# Patient Record
Sex: Male | Born: 1955
Health system: Southern US, Community
[De-identification: ages and names within clinical notes are randomized; demographics above are authoritative.]

## PROBLEM LIST (undated history)

## (undated) DIAGNOSIS — I10 Essential (primary) hypertension: Secondary | ICD-10-CM

## (undated) DIAGNOSIS — J449 Chronic obstructive pulmonary disease, unspecified: Secondary | ICD-10-CM

## (undated) DIAGNOSIS — M109 Gout, unspecified: Secondary | ICD-10-CM

## (undated) DIAGNOSIS — H269 Unspecified cataract: Secondary | ICD-10-CM

## (undated) DIAGNOSIS — N400 Enlarged prostate without lower urinary tract symptoms: Secondary | ICD-10-CM

## (undated) DIAGNOSIS — K219 Gastro-esophageal reflux disease without esophagitis: Secondary | ICD-10-CM

## (undated) DIAGNOSIS — M1990 Unspecified osteoarthritis, unspecified site: Secondary | ICD-10-CM

## (undated) DIAGNOSIS — E785 Hyperlipidemia, unspecified: Secondary | ICD-10-CM

## (undated) HISTORY — DX: Gout, unspecified: M10.9

## (undated) HISTORY — PX: CHOLECYSTECTOMY: SHX55

## (undated) HISTORY — PX: COLONOSCOPY: SHX174

## (undated) HISTORY — DX: Hyperlipidemia, unspecified: E78.5

## (undated) HISTORY — DX: Gastro-esophageal reflux disease without esophagitis: K21.9

## (undated) HISTORY — PX: CATARACT EXTRACTION: SUR2

## (undated) HISTORY — PX: CARPAL TUNNEL RELEASE: SHX101

## (undated) HISTORY — DX: Unspecified cataract: H26.9

## (undated) HISTORY — PX: APPENDECTOMY: SHX54

## (undated) HISTORY — PX: LASIK: SHX215

## (undated) HISTORY — DX: Benign prostatic hyperplasia without lower urinary tract symptoms: N40.0

## (undated) HISTORY — DX: Chronic obstructive pulmonary disease, unspecified: J44.9

## (undated) HISTORY — DX: Unspecified osteoarthritis, unspecified site: M19.90

## (undated) HISTORY — DX: Essential (primary) hypertension: I10

## (undated) HISTORY — PX: POLYPECTOMY: SHX149

---

## 1998-02-10 ENCOUNTER — Ambulatory Visit (HOSPITAL_COMMUNITY): Admission: RE | Admit: 1998-02-10 | Discharge: 1998-02-11 | Payer: Self-pay | Admitting: General Surgery

## 2000-06-13 ENCOUNTER — Encounter: Admission: RE | Admit: 2000-06-13 | Discharge: 2000-06-13 | Payer: Self-pay | Admitting: Family Medicine

## 2000-06-13 ENCOUNTER — Encounter: Payer: Self-pay | Admitting: Family Medicine

## 2001-05-13 ENCOUNTER — Other Ambulatory Visit: Admission: RE | Admit: 2001-05-13 | Discharge: 2001-05-13 | Payer: Self-pay | Admitting: Gastroenterology

## 2001-05-13 ENCOUNTER — Encounter (INDEPENDENT_AMBULATORY_CARE_PROVIDER_SITE_OTHER): Payer: Self-pay | Admitting: Specialist

## 2002-01-29 ENCOUNTER — Emergency Department (HOSPITAL_COMMUNITY): Admission: EM | Admit: 2002-01-29 | Discharge: 2002-01-29 | Payer: Self-pay | Admitting: Emergency Medicine

## 2002-01-29 ENCOUNTER — Encounter: Payer: Self-pay | Admitting: Emergency Medicine

## 2004-07-27 ENCOUNTER — Ambulatory Visit: Payer: Self-pay | Admitting: Family Medicine

## 2004-09-25 ENCOUNTER — Ambulatory Visit: Payer: Self-pay | Admitting: Family Medicine

## 2004-09-25 LAB — CONVERTED CEMR LAB: PSA: 0.96 ng/mL

## 2004-09-28 ENCOUNTER — Ambulatory Visit: Payer: Self-pay | Admitting: Gastroenterology

## 2005-03-20 ENCOUNTER — Ambulatory Visit: Payer: Self-pay | Admitting: Family Medicine

## 2008-01-26 ENCOUNTER — Encounter: Payer: Self-pay | Admitting: Family Medicine

## 2008-01-26 DIAGNOSIS — N400 Enlarged prostate without lower urinary tract symptoms: Secondary | ICD-10-CM | POA: Insufficient documentation

## 2008-01-26 DIAGNOSIS — M109 Gout, unspecified: Secondary | ICD-10-CM | POA: Insufficient documentation

## 2008-01-26 DIAGNOSIS — E785 Hyperlipidemia, unspecified: Secondary | ICD-10-CM | POA: Insufficient documentation

## 2008-01-26 DIAGNOSIS — K219 Gastro-esophageal reflux disease without esophagitis: Secondary | ICD-10-CM | POA: Insufficient documentation

## 2008-01-27 ENCOUNTER — Ambulatory Visit: Payer: Self-pay | Admitting: Family Medicine

## 2008-01-27 DIAGNOSIS — M79609 Pain in unspecified limb: Secondary | ICD-10-CM | POA: Insufficient documentation

## 2008-01-27 DIAGNOSIS — F17210 Nicotine dependence, cigarettes, uncomplicated: Secondary | ICD-10-CM | POA: Insufficient documentation

## 2008-01-27 DIAGNOSIS — R5381 Other malaise: Secondary | ICD-10-CM | POA: Insufficient documentation

## 2008-01-27 DIAGNOSIS — D126 Benign neoplasm of colon, unspecified: Secondary | ICD-10-CM | POA: Insufficient documentation

## 2008-01-27 DIAGNOSIS — R5383 Other fatigue: Secondary | ICD-10-CM

## 2008-01-27 DIAGNOSIS — R55 Syncope and collapse: Secondary | ICD-10-CM | POA: Insufficient documentation

## 2008-02-02 ENCOUNTER — Ambulatory Visit: Payer: Self-pay | Admitting: Family Medicine

## 2008-02-03 LAB — CONVERTED CEMR LAB
AST: 29 units/L (ref 0–37)
Albumin: 3.5 g/dL (ref 3.5–5.2)
Alkaline Phosphatase: 99 units/L (ref 39–117)
BUN: 7 mg/dL (ref 6–23)
Basophils Relative: 0.6 % (ref 0.0–1.0)
Chloride: 112 meq/L (ref 96–112)
Creatinine, Ser: 0.9 mg/dL (ref 0.4–1.5)
Eosinophils Relative: 0.9 % (ref 0.0–5.0)
GFR calc Af Amer: 114 mL/min
Glucose, Bld: 97 mg/dL (ref 70–99)
HCT: 43.9 % (ref 39.0–52.0)
HDL: 17.3 mg/dL — ABNORMAL LOW (ref >39.0)
MCV: 92 fL (ref 78.0–100.0)
Monocytes Absolute: 0.4 10*3/uL (ref 0.1–1.0)
Monocytes Relative: 4.3 % (ref 3.0–12.0)
PSA: 0.83 ng/mL (ref 0.10–4.00)
Platelets: 294 10*3/uL (ref 150–400)
Potassium: 4.6 meq/L (ref 3.5–5.1)
RBC: 4.78 M/uL (ref 4.22–5.81)
Total CHOL/HDL Ratio: 7.7
Total Protein: 6.4 g/dL (ref 6.0–8.3)
WBC: 9.3 10*3/uL (ref 4.5–10.5)

## 2008-02-05 ENCOUNTER — Encounter: Payer: Self-pay | Admitting: Family Medicine

## 2008-02-05 ENCOUNTER — Encounter: Admission: RE | Admit: 2008-02-05 | Discharge: 2008-02-05 | Payer: Self-pay | Admitting: Family Medicine

## 2008-02-07 ENCOUNTER — Emergency Department (HOSPITAL_COMMUNITY): Admission: EM | Admit: 2008-02-07 | Discharge: 2008-02-08 | Payer: Self-pay | Admitting: Emergency Medicine

## 2008-02-07 ENCOUNTER — Encounter: Payer: Self-pay | Admitting: Family Medicine

## 2008-02-14 ENCOUNTER — Emergency Department (HOSPITAL_COMMUNITY): Admission: EM | Admit: 2008-02-14 | Discharge: 2008-02-14 | Payer: Self-pay | Admitting: Internal Medicine

## 2008-02-25 ENCOUNTER — Encounter: Payer: Self-pay | Admitting: Gastroenterology

## 2008-02-26 ENCOUNTER — Ambulatory Visit: Payer: Self-pay | Admitting: Gastroenterology

## 2008-02-29 ENCOUNTER — Encounter: Payer: Self-pay | Admitting: Gastroenterology

## 2008-02-29 ENCOUNTER — Ambulatory Visit (HOSPITAL_COMMUNITY): Admission: RE | Admit: 2008-02-29 | Discharge: 2008-02-29 | Payer: Self-pay | Admitting: Gastroenterology

## 2008-03-02 ENCOUNTER — Ambulatory Visit: Payer: Self-pay | Admitting: Gastroenterology

## 2008-03-23 ENCOUNTER — Telehealth: Payer: Self-pay | Admitting: Gastroenterology

## 2008-03-28 ENCOUNTER — Encounter: Payer: Self-pay | Admitting: Gastroenterology

## 2008-04-04 ENCOUNTER — Telehealth: Payer: Self-pay | Admitting: Family Medicine

## 2008-05-24 HISTORY — PX: OTHER SURGICAL HISTORY: SHX169

## 2008-06-08 ENCOUNTER — Ambulatory Visit: Payer: Self-pay | Admitting: Family Medicine

## 2008-06-08 DIAGNOSIS — I880 Nonspecific mesenteric lymphadenitis: Secondary | ICD-10-CM | POA: Insufficient documentation

## 2008-06-22 ENCOUNTER — Ambulatory Visit: Payer: Self-pay | Admitting: Cardiovascular Disease

## 2008-07-07 ENCOUNTER — Ambulatory Visit: Payer: Self-pay | Admitting: Family Medicine

## 2008-07-07 ENCOUNTER — Encounter: Admission: RE | Admit: 2008-07-07 | Discharge: 2008-07-07 | Payer: Self-pay | Admitting: Family Medicine

## 2008-07-07 DIAGNOSIS — M545 Low back pain, unspecified: Secondary | ICD-10-CM | POA: Insufficient documentation

## 2008-07-12 ENCOUNTER — Encounter: Payer: Self-pay | Admitting: Family Medicine

## 2010-04-26 ENCOUNTER — Ambulatory Visit: Payer: Self-pay | Admitting: Family Medicine

## 2010-04-26 DIAGNOSIS — M7710 Lateral epicondylitis, unspecified elbow: Secondary | ICD-10-CM | POA: Insufficient documentation

## 2010-04-26 LAB — CONVERTED CEMR LAB
Nitrite: NEGATIVE
Protein, U semiquant: NEGATIVE
Urobilinogen, UA: 0.2
WBC Urine, dipstick: NEGATIVE

## 2010-05-01 LAB — CONVERTED CEMR LAB
ALT: 19 units/L (ref 0–53)
Basophils Absolute: 0.1 10*3/uL (ref 0.0–0.1)
Basophils Relative: 1.8 % (ref 0.0–3.0)
Bilirubin, Direct: 0.1 mg/dL (ref 0.0–0.3)
CO2: 24 meq/L (ref 19–32)
Chloride: 105 meq/L (ref 96–112)
Creatinine, Ser: 1 mg/dL (ref 0.4–1.5)
Eosinophils Relative: 1.8 % (ref 0.0–5.0)
Hemoglobin: 15.4 g/dL (ref 13.0–17.0)
Lymphocytes Relative: 28.1 % (ref 12.0–46.0)
Monocytes Relative: 5.3 % (ref 3.0–12.0)
Neutro Abs: 4.7 10*3/uL (ref 1.4–7.7)
PSA: 0.93 ng/mL (ref 0.10–4.00)
RBC: 4.81 M/uL (ref 4.22–5.81)
RDW: 13.5 % (ref 11.5–14.6)
Total CHOL/HDL Ratio: 8
Total Protein: 6.7 g/dL (ref 6.0–8.3)
VLDL: 97.8 mg/dL — ABNORMAL HIGH (ref 0.0–40.0)
WBC: 7.4 10*3/uL (ref 4.5–10.5)

## 2010-09-13 ENCOUNTER — Telehealth: Payer: Self-pay | Admitting: Family Medicine

## 2010-09-13 ENCOUNTER — Encounter: Payer: Self-pay | Admitting: Family Medicine

## 2010-10-23 NOTE — Letter (Signed)
Summary: CDL Form  CDL Form   Imported By: Lanelle Bal 05/01/2010 12:12:41  _____________________________________________________________________  External Attachment:    Type:   Image     Comment:   External Document

## 2010-10-23 NOTE — Assessment & Plan Note (Signed)
Summary: CPX/CDL FORMS/CLE   Vital Signs:  Patient profile:   55 year old male Height:      71.5 inches Weight:      197.75 pounds BMI:     27.29 Temp:     98 degrees F oral Pulse rate:   80 / minute Pulse rhythm:   regular BP sitting:   128 / 84  (left arm) Cuff size:   regular  Vitals Entered By: Lewanda Rife LPN (April 26, 2010 9:39 AM) CC: CPX and CDL form  Vision Screening:Left eye with correction: 20 / 20 Right eye with correction: 20 / 20 Both eyes with correction: 20 / 20        Vision Entered By: Lewanda Rife LPN (April 26, 2010 9:40 AM)  Hearing Screen 25db HL: Left  500 hz: 25db 1000 hz: 25db 2000 hz: 25db 4000 hz: 25db Right  500 hz: 25db 1000 hz: 25db 2000 hz: 25db 4000 hz: 25db    History of Present Illness: here for health mt and DOT form  has been feeling pretty good overall  wt is up 1 lb  one ear bothers him a little -- ? feels like something in it and it itches   R elbow bothers him -- pain occas  no swelling  worse to lift with outstretched arm  no numbness in fingers  nl grip  does shift gears with this arm continuously no meds for this    bp ok at 128/84 today  hearing and vision intact  last psa .83- no prostate or urinary difficulties   due colonosc for polyp in 2014  Td was 05  due for lipid check   smoking status -- still smokes 1 ppd  little more cough in the ams - with some phlegm  feels like he is ready to quit  used patches once and it worked quite well   chooses not to wear sunscreen despite the risks      Allergies (verified): No Known Drug Allergies  Past History:  Past Medical History: Last updated: 06/18/08 GERD Gout Hyperlipidemia Benign prostatic hypertrophy smoker  foot pain  Past Surgical History: Last updated: 06/22/2008 Colonoscopy- polyps (04/2001) Cholecystectomy Appendectomy Carpal Tunnel Release colonoscopy- polyp 6/09 CT abd with enlarged LN is small bowel mesentary 00  5/09 follow up CT -- decrease in LN size and resolution of mesenteric stranding , liver hemangioma 9/09  Family History: Last updated: 2008-06-18 Father: died colon cancer age 62 Mother:  Siblings: brother had CABG age 49  Social History: Last updated: 02/26/2008 Marital Status: Divorced Children: 2 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 Eyes:  Denies blurring, discharge, and eye irritation. ENT:  Denies ear discharge and earache. CV:  Denies chest pain or discomfort, lightheadness, and palpitations. Resp:  Complains of cough; denies wheezing. GI:  Denies abdominal pain, indigestion, nausea, and vomiting. GU:  Denies dysuria and urinary frequency. MS:  Complains of joint pain; denies joint redness and joint swelling. Derm:  Denies itching, lesion(s), poor wound healing, and rash. Neuro:  Denies headaches, numbness, and tingling. Psych:  Denies anxiety and depression. Endo:  Denies excessive thirst and excessive urination. Heme:  Denies abnormal bruising and bleeding.  Physical Exam  General:  Well-developed,well-nourished,in no acute distress; alert,appropriate and cooperative throughout examination Head:  normocephalic, atraumatic, and no abnormalities observed.  Eyes:  vision grossly intact, pupils equal, pupils round, and pupils reactive to light.  no conjunctival pallor, injection or icterus  Ears:  R ear normal and L ear normal.   Nose:  no nasal discharge.   Mouth:  pharynx pink and moist.   Neck:  supple with full rom and no masses or thyromegally, no JVD or carotid bruit  Chest Wall:  No deformities, masses, tenderness or gynecomastia noted. Lungs:  diffusely distant bs no rales/crackles or wheeze  Heart:  Normal rate and regular rhythm. S1 and S2 normal without gallop, murmur, click, rub or  other extra sounds. Abdomen:  Bowel sounds positive,abdomen soft and non-tender without masses, organomegaly or hernias noted. no renal bruits  Rectal:  No external abnormalities noted. Normal sphincter tone. No rectal masses or tenderness. Prostate:  Prostate gland firm and smooth, no enlargement, nodularity, tenderness, mass, asymmetry or induration. Msk:  R lateral elbow tenderness over epicondyle  no swelling  nl grip some pain on supination  nl rom , however   Pulses:  R and L carotid,radial,femoral,dorsalis pedis and posterior tibial pulses are full and equal bilaterally Extremities:  No clubbing, cyanosis, edema, or deformity noted with normal full range of motion of all joints.   Neurologic:  strength normal in all extremities, sensation intact to light touch, gait normal, and DTRs symmetrical and normal.   Skin:  Intact without suspicious lesions or rashes lentigos diffusely with much solar aging Cervical Nodes:  No lymphadenopathy noted Inguinal Nodes:  No significant adenopathy Psych:  normal affect, talkative and pleasant    Impression & Recommendations:  Problem # 1:  HEALTH MAINTENANCE EXAM (ICD-V70.0) reviewed health habits including diet, exercise and skin cancer prevention reviewed health maintenance list and family history  Orders: Venipuncture (31540) TLB-Lipid Panel (80061-LIPID) TLB-CBC Platelet - w/Differential (85025-CBCD) TLB-BMP (Basic Metabolic Panel-BMET) (80048-METABOL) TLB-Hepatic/Liver Function Pnl (80076-HEPATIC) TLB-TSH (Thyroid Stimulating Hormone) (84443-TSH) TLB-PSA (Prostate Specific Antigen) (84153-PSA) UA Dipstick w/o Micro (manual) (08676)  Problem # 2:  TOBACCO USE (ICD-305.1) Assessment: Unchanged adv to quit again feels he may be ready to try nicotine repl otc  rev other aids avail to him discussed in detail risks of smoking, and possible outcomes including COPD, vascular dz, cancer and also respiratory infections/sinus problems  - pt  voiced understanding  Problem # 3:  COLONIC POLYPS (ICD-211.3) Assessment: Comment Only due for cs in 2014- no problems  Problem # 4:  HYPERLIPIDEMIA (ICD-272.4) Assessment: Unchanged  lab today rev low sat fat diet  Orders: Venipuncture (19509) TLB-Lipid Panel (80061-LIPID) TLB-CBC Platelet - w/Differential (85025-CBCD) TLB-BMP (Basic Metabolic Panel-BMET) (80048-METABOL) TLB-Hepatic/Liver Function Pnl (80076-HEPATIC) TLB-TSH (Thyroid Stimulating Hormone) (84443-TSH) TLB-PSA (Prostate Specific Antigen) (84153-PSA)  Labs Reviewed: SGOT: 29 (02/02/2008)   SGPT: 40 (02/02/2008)   HDL:17.3 (02/02/2008)  LDL:91 (02/02/2008)  Chol:134 (02/02/2008)  Trig:128 (02/02/2008)  Problem # 5:  LATERAL EPICONDYLITIS (ICD-726.32) Assessment: New mild with pain but no swelling  adv use of forearm band/ relative rest and ice several times per day if not imp f/u for further eval -- poss injection  Complete Medication List: 1)  Prilosec 20 Mg Cpdr (Omeprazole) .Marland Kitchen.. 1 by mouth q am 30 min before breakfast as needed  Patient Instructions: 1)  ice your elbow three times a day for 5 minutes  2)  get a forearm band at a drugstore for "tennis elbow" 3)  if not improved in 2 weeks - update me 4)  work on quitting smoking  5)  labs today  Current Allergies (reviewed  today): No known allergies    Laboratory Results   Urine Tests  Date/Time Received: April 26, 2010 10:12 AM  Date/Time Reported: April 26, 2010 10:12 AM   Routine Urinalysis   Color: yellow Appearance: Clear Glucose: negative   (Normal Range: Negative) Bilirubin: negative   (Normal Range: Negative) Ketone: negative   (Normal Range: Negative) Spec. Gravity: 1.020   (Normal Range: 1.003-1.035) Blood: negative   (Normal Range: Negative) pH: 5.0   (Normal Range: 5.0-8.0) Protein: negative   (Normal Range: Negative) Urobilinogen: 0.2   (Normal Range: 0-1) Nitrite: negative   (Normal Range: Negative) Leukocyte Esterace:  negative   (Normal Range: Negative)

## 2010-10-25 NOTE — Letter (Signed)
Summary: CDL certificate  CDL certificate   Imported By: Lester Obion 09/25/2010 10:47:01  _____________________________________________________________________  External Attachment:    Type:   Image     Comment:   External Document

## 2010-10-25 NOTE — Progress Notes (Signed)
Summary: CDL card to be filled out  Phone Note Call from Patient Call back at [phone] or mobile # [phone]   Caller: Patient Call For: Judith Part MD Summary of Call: Pt brought by CDL card to be filled out. I called pt to ck to see if he needs this it looks like CDL card was filled out in 04/2010. Left message for patient to call back. Lewanda Rife LPN  September 13, 2010 5:08 PM   Follow-up for Phone Call        Spoke with pt and he said needs new CDL card filled out with Medical certificate expiration date listed at 2 yrs from date of exam. Pt's card received on 04/26/10 was an older card and did not have a place for that info. Card is on your shelf in the inbox.Lewanda Rife LPN  September 14, 2010 2:27 PM   Additional Follow-up for Phone Call Additional follow up Details #1::        done in IN box Additional Follow-up by: Judith Part MD,  September 14, 2010 3:12 PM    Additional Follow-up for Phone Call Additional follow up Details #2::    Patient notified as instructed by telephone. Copy of card sent for scanning. Card left at front desk. Lewanda Rife LPN  September 14, 2010 3:57 PM

## 2011-06-19 LAB — POCT I-STAT, CHEM 8
Creatinine, Ser: 1
HCT: 40
Hemoglobin: 13.6
Potassium: 3.8
Sodium: 141

## 2012-05-15 ENCOUNTER — Encounter: Payer: Self-pay | Admitting: Family Medicine

## 2012-05-18 ENCOUNTER — Encounter: Payer: Self-pay | Admitting: Family Medicine

## 2012-05-18 ENCOUNTER — Ambulatory Visit (INDEPENDENT_AMBULATORY_CARE_PROVIDER_SITE_OTHER): Payer: BC Managed Care – PPO | Admitting: Family Medicine

## 2012-05-18 VITALS — BP 130/82 | HR 77 | Temp 97.9°F | Ht 72.0 in | Wt 190.2 lb

## 2012-05-18 DIAGNOSIS — N400 Enlarged prostate without lower urinary tract symptoms: Secondary | ICD-10-CM

## 2012-05-18 DIAGNOSIS — Z125 Encounter for screening for malignant neoplasm of prostate: Secondary | ICD-10-CM | POA: Insufficient documentation

## 2012-05-18 DIAGNOSIS — F17200 Nicotine dependence, unspecified, uncomplicated: Secondary | ICD-10-CM

## 2012-05-18 DIAGNOSIS — Z23 Encounter for immunization: Secondary | ICD-10-CM

## 2012-05-18 DIAGNOSIS — Z0000 Encounter for general adult medical examination without abnormal findings: Secondary | ICD-10-CM | POA: Insufficient documentation

## 2012-05-18 DIAGNOSIS — D126 Benign neoplasm of colon, unspecified: Secondary | ICD-10-CM

## 2012-05-18 DIAGNOSIS — E785 Hyperlipidemia, unspecified: Secondary | ICD-10-CM

## 2012-05-18 LAB — CBC WITH DIFFERENTIAL/PLATELET
Basophils Relative: 0.5 % (ref 0.0–3.0)
Eosinophils Relative: 0.8 % (ref 0.0–5.0)
Hemoglobin: 14.8 g/dL (ref 13.0–17.0)
Lymphocytes Relative: 19.8 % (ref 12.0–46.0)
MCHC: 32.7 g/dL (ref 30.0–36.0)
Monocytes Relative: 5.4 % (ref 3.0–12.0)
Neutro Abs: 5.9 10*3/uL (ref 1.4–7.7)
RBC: 4.84 Mil/uL (ref 4.22–5.81)
WBC: 8.1 10*3/uL (ref 4.5–10.5)

## 2012-05-18 LAB — POCT URINALYSIS DIPSTICK
Bilirubin, UA: NEGATIVE
Glucose, UA: NEGATIVE
Ketones, UA: NEGATIVE
Leukocytes, UA: NEGATIVE
Nitrite, UA: NEGATIVE

## 2012-05-18 LAB — COMPREHENSIVE METABOLIC PANEL
Albumin: 3.7 g/dL (ref 3.5–5.2)
Alkaline Phosphatase: 71 U/L (ref 39–117)
BUN: 10 mg/dL (ref 6–23)
CO2: 26 mEq/L (ref 19–32)
GFR: 107.75 mL/min (ref >60.00)
Glucose, Bld: 91 mg/dL (ref 70–99)
Sodium: 141 mEq/L (ref 135–145)
Total Bilirubin: 0.5 mg/dL (ref 0.3–1.2)
Total Protein: 6.3 g/dL (ref 6.0–8.3)

## 2012-05-18 LAB — PSA: PSA: 1.01 ng/mL (ref 0.10–4.00)

## 2012-05-18 LAB — LIPID PANEL
Cholesterol: 173 mg/dL (ref 0–200)
HDL: 26.8 mg/dL — ABNORMAL LOW (ref >39.00)

## 2012-05-18 LAB — TSH: TSH: 1.18 u[IU]/mL (ref 0.35–5.50)

## 2012-05-18 NOTE — Patient Instructions (Addendum)
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

## 2012-05-18 NOTE — Assessment & Plan Note (Signed)
Up to date on colonosc for another year No stool changes

## 2012-05-18 NOTE — Progress Notes (Signed)
Subjective:    Patient ID: [name] [name], male    DOB: 05-Feb-1956, 56 y.o.   MRN: [id number]  HPI Here for health maintenance exam and to review chronic medical problems   Is feeling ok  Nothing new going on   Wt is down 7 lb with bmi of 25 Put in a lot of work hours -- lots and lots of work and not eating as much and moving more  Still smokes-no change , about a pack per day  Does want to quit  Is starting to affect his breathing  Wants to quit cold [place]  Will plan a quit day soon  He has some e cig-- likes that ok , uses occasionally   colonosc 6/09- will be due for recall for polyp in 1 year  Flu shot-did not get one last fall  ptx-- unsure if he wants one   Hx of BPH Not a lot of problems urinating lately  No nocturia  No prostate cancer in family   Hx of hyperlipidemia Lab Results  Component Value Date   CHOL 198 04/26/2010   HDL 24.20* 04/26/2010   LDLCALC 91 02/02/2008   LDLDIRECT 93.3 04/26/2010   TRIG 489.0* 04/26/2010   CHOLHDL 8 04/26/2010    Is due for labs in general Eating less Diet is fair - not too bad for fried and greasy foods   Patient Active Problem List  Diagnosis  . COLONIC POLYPS  . HYPERLIPIDEMIA  . GOUT  . NONSPECIFIC MESENTERIC LYMPHADENITIS  . TOBACCO USE  . GERD  . BENIGN PROSTATIC HYPERTROPHY  . BACK PAIN, LUMBAR  . LATERAL EPICONDYLITIS  . FOOT PAIN, LEFT  . SYNCOPE  . FATIGUE  . Routine general medical examination at a health care facility   Past Medical History  Diagnosis Date  . GERD (gastroesophageal reflux disease)   . Gout   . Hyperlipidemia   . Benign prostatic hypertrophy    Past Surgical History  Procedure Date  . Colonoscopy 04/2001 6/09    polyps  . Cholecystectomy   . Appendectomy   . Carpal tunnel release   . Liver hemangioma 9/09   History  Substance Use Topics  . Smoking status: Current Everyday Smoker -- 1.0 packs/day  . Smokeless tobacco: Not on file  . Alcohol Use: No   Family History  Problem  Relation Age of Onset  . Cancer Father   . Heart disease Brother    No Known Allergies Current Outpatient Prescriptions on File Prior to Visit  Medication Sig Dispense Refill  . omeprazole (PRILOSEC) 20 MG capsule Take 20 mg by mouth daily.        Also needs DOT form filled out today Wears contacts and glasses  Review of Systems Review of Systems  Constitutional: Negative for fever, appetite change, fatigue and unexpected weight change.  Eyes: Negative for pain and visual disturbance.  Respiratory: Negative for sob/wheeze, pos for some cough in am from smoking   Cardiovascular: Negative for cp or palpitations    Gastrointestinal: Negative for nausea, diarrhea and constipation.  Genitourinary: Negative for urgency and frequency.  Skin: Negative for pallor or rash   Neurological: Negative for weakness, light-headedness, numbness and headaches.  Hematological: Negative for adenopathy. Does not bruise/bleed easily.  Psychiatric/Behavioral: Negative for dysphoric mood. The patient is not nervous/anxious.         Objective:   Physical Exam  Constitutional: He appears well-developed and well-nourished. No distress.  HENT:  Head: Normocephalic and atraumatic.  Right Ear: External ear normal.  Left Ear: External ear normal.  Nose: Nose normal.  Mouth/Throat: Oropharynx is clear and moist.  Eyes: Conjunctivae and EOM are normal. Pupils are equal, round, and reactive to light. No scleral icterus.  Neck: Normal range of motion. Neck supple. No JVD present. No thyromegaly present.  Cardiovascular: Normal rate, regular rhythm, normal heart sounds and intact distal pulses.  Exam reveals no gallop.   No murmur heard. Pulmonary/Chest: Effort normal and breath sounds normal. No respiratory distress. He has no wheezes.  Abdominal: Soft. Bowel sounds are normal. He exhibits no distension and no mass. There is no tenderness.  Genitourinary: Rectum normal and prostate normal. Rectal exam shows no  mass, no tenderness and anal tone normal. Prostate is not enlarged and not tender.  Musculoskeletal: Normal range of motion. He exhibits no edema and no tenderness.  Lymphadenopathy:    He has no cervical adenopathy.  Neurological: He is alert. He has normal reflexes. No cranial nerve deficit. He exhibits normal muscle tone. Coordination normal.  Skin: Skin is warm and dry. No rash noted. No erythema. No pallor.       Tanned            Assessment & Plan:

## 2012-05-18 NOTE — Assessment & Plan Note (Signed)
Disc in detail risks of smoking and possible outcomes including copd, vascular/ heart disease, cancer , respiratory and sinus infections  Pt voices understanding  He is getting ready to set a quit date soon - has and E cigarette

## 2012-05-18 NOTE — Assessment & Plan Note (Addendum)
Reviewed health habits including diet and exercise and skin cancer prevention Also reviewed health mt list, fam hx and immunizations  Adv to quit smoking  DRE today Labs today Also form for DOT cert filled out today - vision/' hearing/ ua

## 2012-05-18 NOTE — Assessment & Plan Note (Signed)
Nl DRE despite hx of BPH Psa with labs today

## 2012-05-18 NOTE — Assessment & Plan Note (Signed)
No problems with this lately Nl DRE  Will check psa with labs

## 2012-05-18 NOTE — Assessment & Plan Note (Signed)
Check lipids  Disc diet- a bit better High trig in past  Rev low sat fat diet

## 2013-02-02 ENCOUNTER — Encounter: Payer: Self-pay | Admitting: Gastroenterology

## 2014-04-21 ENCOUNTER — Encounter: Payer: Self-pay | Admitting: Gastroenterology

## 2015-01-23 ENCOUNTER — Telehealth: Payer: Self-pay | Admitting: Family Medicine

## 2015-01-23 NOTE — Telephone Encounter (Signed)
Pt notified of Dr. Tower's comments/recommendations and verbalized understanding  

## 2015-01-23 NOTE — Telephone Encounter (Signed)
I will see him then - if symptoms return or worsen please let us know or seek care in UC or ER if after hours

## 2015-01-23 NOTE — Telephone Encounter (Signed)
Pt advise of Dr. Marliss Coots comments/recommendation

## 2015-01-23 NOTE — Telephone Encounter (Signed)
[facility]  Patient Name: [name] [name]  Gender: Male  DOB: 08/14/1956   Age: 59 Y 72 M 1 D  Return Phone Number: [phone] (Primary)  Address: [address]   City/State/Zip: [place] [address]   Client [facility] [facility] - Client  Client Site [facility] - Day  Physician [place], [place]   Contact Type Call  Call Type Triage / Clinical     Relationship To Patient Self  Appointment Disposition EMR Patient Reports Appointment Already Scheduled  Info pasted into Epic Yes  Return Phone Number [phone] (Primary)  Chief Complaint Pain - Generalized  Initial Comment Caller states that he has a apot that burns, wanted a physical     PreDisposition Call Doctor      Nurse Assessment  Nurse: Wynetta Emery, RN, Baker Janus Date/Time Eilene Ghazi Time): 01/23/2015 4:15:05 PM  Confirm and document reason for call. If symptomatic, describe symptoms. ---Audry Pili states he has spots that burn in chest onset month or two wants checked out and a physical  Has the patient traveled out of the country within the last 30 days? ---No  Does the patient require triage? ---Yes  Related visit to physician within the last 2 weeks? ---No  Does the PT have any chronic conditions? (i.e. diabetes, asthma, etc.) ---Unknown     Guidelines      Guideline Title Affirmed Question Affirmed Notes Nurse Date/Time Eilene Ghazi Time)  Chest Pain Chest pain(s) lasting a few seconds (all triage questions negative) dull pain that comes and goes ongoing for 1 to 2 months not constant lasting 15 minutes then goes away [place], Pension scheme manager 01/23/2015 4:17:15 PM   Disp. Time Eilene Ghazi Time) Disposition Final User          01/23/2015 4:20:38 PM Home Care Yes Wynetta Emery, RN, Christin Bach Understands: Yes  Disagree/Comply: Comply     Care Advice Given Per Guideline      HOME CARE: You should be able to  treat this at home. REASSURANCE: Fleeting chest pains that last only a few seconds and then go away are usually not serious. They may be from the muscles, nerves or joints of your chest wall. IBUPROFEN (E.G., MOTRIN, ADVIL): EXPECTED COURSE: You should see a doctor within 1 week if there is no improvement in this pain. * Chest pain increases in frequency, duration or severity * Chest pain lasts over 5 minutes * Chest pains persist over 3 days * Difficulty breathing or unusual sweating occurs   After Care Instructions Given     Call Event Type User Date / Time Description        Comments  User: Michele Rockers, RN Date/Time Eilene Ghazi Time): 01/23/2015 4:22:52 PM  He scheduled appt for Dr. Glori Bickers this coming Monday 230pm [place] Nurse triaged him to see MD in week

## 2015-01-30 ENCOUNTER — Ambulatory Visit (INDEPENDENT_AMBULATORY_CARE_PROVIDER_SITE_OTHER): Payer: BLUE CROSS/BLUE SHIELD | Admitting: Family Medicine

## 2015-01-30 ENCOUNTER — Encounter: Payer: Self-pay | Admitting: Family Medicine

## 2015-01-30 ENCOUNTER — Encounter: Payer: Self-pay | Admitting: Gastroenterology

## 2015-01-30 ENCOUNTER — Ambulatory Visit (INDEPENDENT_AMBULATORY_CARE_PROVIDER_SITE_OTHER)
Admission: RE | Admit: 2015-01-30 | Discharge: 2015-01-30 | Disposition: A | Discharge status: Home / Self Care | Payer: BLUE CROSS/BLUE SHIELD | Source: Ambulatory Visit | Attending: Family Medicine | Admitting: Family Medicine

## 2015-01-30 VITALS — BP 148/82 | HR 80 | Temp 98.4°F | Ht 71.0 in | Wt 188.0 lb

## 2015-01-30 DIAGNOSIS — I10 Essential (primary) hypertension: Secondary | ICD-10-CM | POA: Insufficient documentation

## 2015-01-30 DIAGNOSIS — F17200 Nicotine dependence, unspecified, uncomplicated: Secondary | ICD-10-CM

## 2015-01-30 DIAGNOSIS — Z800 Family history of malignant neoplasm of digestive organs: Secondary | ICD-10-CM

## 2015-01-30 DIAGNOSIS — R030 Elevated blood-pressure reading, without diagnosis of hypertension: Secondary | ICD-10-CM | POA: Diagnosis not present

## 2015-01-30 DIAGNOSIS — Z125 Encounter for screening for malignant neoplasm of prostate: Secondary | ICD-10-CM

## 2015-01-30 DIAGNOSIS — E785 Hyperlipidemia, unspecified: Secondary | ICD-10-CM | POA: Diagnosis not present

## 2015-01-30 DIAGNOSIS — Z720 Tobacco use: Secondary | ICD-10-CM

## 2015-01-30 DIAGNOSIS — R059 Cough, unspecified: Secondary | ICD-10-CM

## 2015-01-30 DIAGNOSIS — IMO0001 Reserved for inherently not codable concepts without codable children: Secondary | ICD-10-CM

## 2015-01-30 DIAGNOSIS — Z23 Encounter for immunization: Secondary | ICD-10-CM | POA: Diagnosis not present

## 2015-01-30 DIAGNOSIS — Z8042 Family history of malignant neoplasm of prostate: Secondary | ICD-10-CM

## 2015-01-30 DIAGNOSIS — Z0000 Encounter for general adult medical examination without abnormal findings: Secondary | ICD-10-CM | POA: Diagnosis not present

## 2015-01-30 DIAGNOSIS — R05 Cough: Secondary | ICD-10-CM

## 2015-01-30 NOTE — Assessment & Plan Note (Signed)
Reviewed health habits including diet and exercise and skin cancer prevention Reviewed appropriate screening tests for age  Also reviewed health mt list, fam hx and immunization status , as well as social and family history   See HPI Lab today Tdap today colonosc ref today

## 2015-01-30 NOTE — Assessment & Plan Note (Signed)
In 40 year smoker - almost 40 pk year Disc plan for cessation in detail Suspect copd starting  cxr today  May order spirometry   F/u 1 mo

## 2015-01-30 NOTE — Progress Notes (Signed)
Subjective:    Patient ID: [name] [name], male    DOB: Jan 04, 1956, 59 y.o.   MRN: [id number]  HPI Here for health maintenance exam and to review chronic medical problems   Also chest symptoms   Has been working a lot  No time off -in 10 years - would rather have the money  Lives by himself with house payments   Has had some burning in his chest - both sides  Around a month  Comes and goes  Smoking brings it on - thinks it is his lungs - he is coughing and wheezing as well  No exertional symptoms Has am cough when he gets up  Is more sob with exertion- just a little   Wt is down 2 lb with bmi of 26  BP Readings from Last 3 Encounters:  01/30/15 142/86  05/18/12 130/82  04/26/10 128/84   up a bit on first check today He checks it at home - can be as high as 364 systolic   Hep C/ HIV screen = declines/not high risk   Td 7/05 - due for that   Flu shot -had in 2015 -? Unsure what month   colonosc 6/09- hyperplastic polyp- recall was rec for 2014 - is ok to set that up   Hyperlipidemia Due for labs - ate a PB cookie this am   Prostate cancer screening - no problems with stream/ no nocturia  occ urgency  Drinks diet green tea - and occ soft drinks   Smoking -knows he wants to quit  In the past - cold [place]  E cig - tried and it gave him a headache  Thinking of hypnotism - may try that  He smokes because he is bored -and needs a break from work  3/4 of a pack per day (no cig after 9 pm)  40 years - more than that   Patient Active Problem List   Diagnosis Date Noted  . Cough 01/30/2015  . Elevated blood pressure 01/30/2015  . Family history of colon cancer 01/30/2015  . Routine general medical examination at a health care facility 05/18/2012  . Prostate cancer screening 05/18/2012  . LATERAL EPICONDYLITIS 04/26/2010  . BACK PAIN, LUMBAR 07/07/2008  . NONSPECIFIC MESENTERIC LYMPHADENITIS 06/08/2008  . COLONIC POLYPS 01/27/2008  . TOBACCO USE 01/27/2008  .  FOOT PAIN, LEFT 01/27/2008  . SYNCOPE 01/27/2008  . FATIGUE 01/27/2008  . Hyperlipidemia 01/26/2008  . GOUT 01/26/2008  . GERD 01/26/2008  . BENIGN PROSTATIC HYPERTROPHY 01/26/2008   Past Medical History  Diagnosis Date  . GERD (gastroesophageal reflux disease)   . Gout   . Hyperlipidemia   . Benign prostatic hypertrophy    Past Surgical History  Procedure Laterality Date  . Colonoscopy  04/2001 6/09    polyps  . Cholecystectomy    . Appendectomy    . Carpal tunnel release    . Liver hemangioma  9/09   History  Substance Use Topics  . Smoking status: Current Every Day Smoker -- 0.75 packs/day    Types: Cigarettes  . Smokeless tobacco: Not on file  . Alcohol Use: No   Family History  Problem Relation Age of Onset  . Cancer Father   . Heart disease Brother    No Known Allergies No current outpatient prescriptions on file prior to visit.   No current facility-administered medications on file prior to visit.     Review of Systems Review of Systems  Constitutional: Negative for fever,  appetite change, fatigue and unexpected weight change.  Eyes: Negative for pain and visual disturbance.  Respiratory: pos for intermittent  for cough and shortness of breath. (and occ wheeze)  Cardiovascular: Negative for cp or palpitations    Gastrointestinal: Negative for nausea, diarrhea and constipation.  Genitourinary: Negative for urgency and frequency.  Skin: Negative for pallor or rash   Neurological: Negative for weakness, light-headedness, numbness and headaches.  Hematological: Negative for adenopathy. Does not bruise/bleed easily.  Psychiatric/Behavioral: Negative for dysphoric mood. The patient is not nervous/anxious.         Objective:   Physical Exam  Constitutional: He appears well-developed and well-nourished. No distress.  HENT:  Head: Normocephalic and atraumatic.  Right Ear: External ear normal.  Left Ear: External ear normal.  Nose: Nose normal.    Mouth/Throat: Oropharynx is clear and moist.  Eyes: Conjunctivae and EOM are normal. Pupils are equal, round, and reactive to light. Right eye exhibits no discharge. Left eye exhibits no discharge. No scleral icterus.  Neck: Normal range of motion. Neck supple. No JVD present. Carotid bruit is not present. No thyromegaly present.  Cardiovascular: Normal rate, regular rhythm, normal heart sounds and intact distal pulses.  Exam reveals no gallop.   Pulmonary/Chest: Effort normal and breath sounds normal. No respiratory distress. He has no wheezes. He exhibits no tenderness.  Diffusely distant bs  No wheeze  Fair air exch   Abdominal: Soft. Bowel sounds are normal. He exhibits no distension, no abdominal bruit and no mass. There is no tenderness.  Musculoskeletal: He exhibits no edema or tenderness.  Lymphadenopathy:    He has no cervical adenopathy.  Neurological: He is alert. He has normal reflexes. No cranial nerve deficit. He exhibits normal muscle tone. Coordination normal.  Skin: Skin is warm and dry. No rash noted. No erythema. No pallor.  Tanned with solar aging and some keratoses on arms and face     Psychiatric: He has a normal mood and affect.          Assessment & Plan:   Problem List Items Addressed This Visit    Cough    In 40 year smoker - almost 40 pk year Disc plan for cessation in detail Suspect copd starting  cxr today  May order spirometry   F/u 1 mo        Relevant Orders   DG Chest 2 View (Completed)   Elevated blood pressure    Handout given on DASH diet and enc to quit smoking Rev water intake and sodium avoidance F/u 1 mo with his cuff Lab today      Family history of colon cancer    In father Pt overdue for colonosc for screen (was due 2014) Also a smoker Ref done       Relevant Orders   Ambulatory referral to Gastroenterology   Hyperlipidemia - Primary    Lab today Disc goals for lipids and reasons to control them Rev low sat fat  diet in detail       Prostate cancer screening    No symptoms  PSA with labs today      Relevant Orders   PSA   Routine general medical examination at a health care facility    Reviewed health habits including diet and exercise and skin cancer prevention Reviewed appropriate screening tests for age  Also reviewed health mt list, fam hx and immunization status , as well as social and family history   See HPI Lab today Tdap  today colonosc ref today        Relevant Orders   CBC with Differential/Platelet   Comprehensive metabolic panel   TSH   Lipid panel   TOBACCO USE    Disc in detail risks of smoking and possible outcomes including copd, vascular/ heart disease, cancer , respiratory and sinus infections  Pt voices understanding Pt aware he wants to quit - has tried many methods/ best luck with cold [place]  He will investigate hypnotism       Relevant Orders   DG Chest 2 View (Completed)    Other Visit Diagnoses    Family history of prostate cancer        Need for Tdap vaccination        Relevant Orders    Tdap vaccine greater than or equal to 7yo IM (Completed)

## 2015-01-30 NOTE — Assessment & Plan Note (Signed)
No symptoms  PSA with labs today

## 2015-01-30 NOTE — Assessment & Plan Note (Signed)
Handout given on DASH diet and enc to quit smoking Rev water intake and sodium avoidance F/u 1 mo with his cuff Lab today

## 2015-01-30 NOTE — Assessment & Plan Note (Signed)
In father Pt overdue for colonosc for screen (was due 2014) Also a smoker Ref done

## 2015-01-30 NOTE — Assessment & Plan Note (Signed)
Lab today Disc goals for lipids and reasons to control them Rev low sat fat diet in detail  

## 2015-01-30 NOTE — Assessment & Plan Note (Signed)
Disc in detail risks of smoking and possible outcomes including copd, vascular/ heart disease, cancer , respiratory and sinus infections  Pt voices understanding Pt aware he wants to quit - has tried many methods/ best luck with cold [place]  He will investigate hypnotism

## 2015-01-30 NOTE — Patient Instructions (Addendum)
Start working on quitting smoking  Nicotine replacement may be helpful  Try hypnotism  Chest xray today  Tdap vaccine today  Labs today  Stop at check out for referral for colonoscopy  Blood pressure is elevated -see if you can cut down or quit smoking - and watch out for sodium in your foods   (see the DASH diet handout)  Drink more water and stay active  Follow up in 1 month - bring your blood pressure cuff with you please (and your record)

## 2015-01-30 NOTE — Progress Notes (Signed)
Pre visit review using our clinic review tool, if applicable. No additional management support is needed unless otherwise documented below in the visit note. 

## 2015-01-31 LAB — CBC WITH DIFFERENTIAL/PLATELET
BASOS ABS: 0 10*3/uL (ref 0.0–0.1)
Basophils Relative: 0.2 % (ref 0.0–3.0)
EOS PCT: 1.1 % (ref 0.0–5.0)
Eosinophils Absolute: 0.1 10*3/uL (ref 0.0–0.7)
HCT: 45.8 % (ref 39.0–52.0)
HEMOGLOBIN: 15.5 g/dL (ref 13.0–17.0)
LYMPHS PCT: 21.4 % (ref 12.0–46.0)
Lymphs Abs: 2.3 10*3/uL (ref 0.7–4.0)
MCHC: 33.9 g/dL (ref 30.0–36.0)
MCV: 90.4 fl (ref 78.0–100.0)
MONOS PCT: 4.9 % (ref 3.0–12.0)
Monocytes Absolute: 0.5 10*3/uL (ref 0.1–1.0)
NEUTROS ABS: 7.6 10*3/uL (ref 1.4–7.7)
Neutrophils Relative %: 72.4 % (ref 43.0–77.0)
Platelets: 267 10*3/uL (ref 150.0–400.0)
RBC: 5.06 Mil/uL (ref 4.22–5.81)
RDW: 13.3 % (ref 11.5–15.5)
WBC: 10.6 10*3/uL — AB (ref 4.0–10.5)

## 2015-01-31 LAB — LIPID PANEL
CHOL/HDL RATIO: 6
CHOLESTEROL: 196 mg/dL (ref 0–200)
HDL: 30.7 mg/dL — ABNORMAL LOW (ref >39.00)
NonHDL: 165.3
TRIGLYCERIDES: 204 mg/dL — AB (ref 0.0–149.0)
VLDL: 40.8 mg/dL — AB (ref 0.0–40.0)

## 2015-01-31 LAB — COMPREHENSIVE METABOLIC PANEL
ALBUMIN: 4.3 g/dL (ref 3.5–5.2)
ALT: 21 U/L (ref 0–53)
AST: 20 U/L (ref 0–37)
Alkaline Phosphatase: 93 U/L (ref 39–117)
BUN: 10 mg/dL (ref 6–23)
CO2: 27 meq/L (ref 19–32)
Calcium: 9.6 mg/dL (ref 8.4–10.5)
Chloride: 107 mEq/L (ref 96–112)
Creatinine, Ser: 0.97 mg/dL (ref 0.40–1.50)
GFR: 84.21 mL/min (ref >60.00)
GLUCOSE: 78 mg/dL (ref 70–99)
POTASSIUM: 4.6 meq/L (ref 3.5–5.1)
SODIUM: 141 meq/L (ref 135–145)
TOTAL PROTEIN: 7.1 g/dL (ref 6.0–8.3)
Total Bilirubin: 0.4 mg/dL (ref 0.2–1.2)

## 2015-01-31 LAB — PSA: PSA: 1.1 ng/mL (ref 0.10–4.00)

## 2015-01-31 LAB — LDL CHOLESTEROL, DIRECT: LDL DIRECT: 133 mg/dL

## 2015-01-31 LAB — TSH: TSH: 1.34 u[IU]/mL (ref 0.35–4.50)

## 2015-02-02 ENCOUNTER — Telehealth: Payer: Self-pay | Admitting: Family Medicine

## 2015-02-02 MED ORDER — AZITHROMYCIN 250 MG PO TABS: ORAL_TABLET | ORAL | Status: DC

## 2015-02-02 NOTE — Telephone Encounter (Signed)
-----   Message from Tammi Sou, [place] sent at 02/02/2015 10:30 AM EDT ----- Pt notified of lab results and Dr. Marliss Coots comments. Pt said his cough is about the same from his OV with Dr. Glori Bickers and it's worse at night and 1st thing in the morning. Pt hasn't taken his temp so he isn't aware if he has had a fever but he has had a few cold chills/night sweats at night.

## 2015-02-02 NOTE — Telephone Encounter (Signed)
Please send in px for zithromax  Update if no improvement with this  Please work on quitting smoking

## 2015-02-03 MED ORDER — AZITHROMYCIN 250 MG PO TABS: ORAL_TABLET | ORAL | Status: DC

## 2015-02-03 NOTE — Telephone Encounter (Signed)
Rx sent to pharmacy and pt notified of Dr. Tower's comments  

## 2015-02-10 ENCOUNTER — Ambulatory Visit (INDEPENDENT_AMBULATORY_CARE_PROVIDER_SITE_OTHER): Payer: BLUE CROSS/BLUE SHIELD | Admitting: *Deleted

## 2015-02-10 DIAGNOSIS — R0602 Shortness of breath: Secondary | ICD-10-CM

## 2015-02-10 DIAGNOSIS — R059 Cough, unspecified: Secondary | ICD-10-CM

## 2015-02-10 DIAGNOSIS — R05 Cough: Secondary | ICD-10-CM

## 2015-03-03 ENCOUNTER — Ambulatory Visit: Payer: BLUE CROSS/BLUE SHIELD | Admitting: Family Medicine

## 2015-03-06 ENCOUNTER — Encounter: Payer: Self-pay | Admitting: Family Medicine

## 2015-03-06 ENCOUNTER — Ambulatory Visit (INDEPENDENT_AMBULATORY_CARE_PROVIDER_SITE_OTHER): Payer: BLUE CROSS/BLUE SHIELD | Admitting: Family Medicine

## 2015-03-06 VITALS — BP 156/82 | HR 75 | Temp 97.5°F | Ht 71.0 in | Wt 192.5 lb

## 2015-03-06 DIAGNOSIS — R059 Cough, unspecified: Secondary | ICD-10-CM

## 2015-03-06 DIAGNOSIS — R05 Cough: Secondary | ICD-10-CM | POA: Diagnosis not present

## 2015-03-06 DIAGNOSIS — Z720 Tobacco use: Secondary | ICD-10-CM | POA: Diagnosis not present

## 2015-03-06 DIAGNOSIS — I10 Essential (primary) hypertension: Secondary | ICD-10-CM

## 2015-03-06 DIAGNOSIS — F17200 Nicotine dependence, unspecified, uncomplicated: Secondary | ICD-10-CM

## 2015-03-06 MED ORDER — AMLODIPINE BESYLATE 5 MG PO TABS: 5.0000 mg | ORAL_TABLET | Freq: Every day | ORAL | Status: DC

## 2015-03-06 NOTE — Assessment & Plan Note (Signed)
Disc imp of tx for future health and prev of end organ damage BP: (!) 156/82 mmHg    Px amlodipine 5 mg  Disc poss side eff/will update Rev diet /exercise (DASH diet) and also imp of smoking cessation  Lab reviewed F/u planned

## 2015-03-06 NOTE — Assessment & Plan Note (Signed)
Ongoing  Some imp with zpak  Continues to smoke Reassuring cxr and spirometry  Ref to pulm for further eval  Also enc smoking cessation

## 2015-03-06 NOTE — Patient Instructions (Signed)
Stop at check out for referral to pulmonary Keep thinking about quitting smoking  Start amlodipine 5 mg daily in am - update me if any side effects  Also avoid salty food/processed food  Follow up in 4-6 weeks

## 2015-03-06 NOTE — Progress Notes (Signed)
Subjective:    Patient ID: [name] [name], male    DOB: 11/15/1955, 59 y.o.   MRN: [id number]  HPI Here for f/u of cough and elevated bp   EXAM: CHEST 2 VIEW  COMPARISON: Feb 07, 2008.  FINDINGS: The heart size and mediastinal contours are within normal limits. Both lungs are clear. No pneumothorax or pleural effusion is noted. The visualized skeletal structures are unremarkable.  IMPRESSION: No active cardiopulmonary disease.   Electronically Signed  By: Marijo Conception, M.D.  On: 01/30/2015 16:36  Then did spirometry test - that came out ok   So treated with zithromax  Has improved a little bit - still coughs (? Due to smoking)  He would like to quit smoking  He is not ready- but wants to quit this year  Coughs up mucous in am - mostly clear / little cloudy  Hx of GERD in the past  No heartburn or other symptoms right now   Has smoked approx 1 ppd - for about 40 years    BP is still high  Has been checking it at home  Is labile  Generally 503U systolic  No HA or cp  BP Readings from Last 3 Encounters:  03/06/15 156/82  01/30/15 148/82  05/18/12 130/82      Chemistry      Component Value Date/Time   NA 141 01/30/2015 1529   K 4.6 01/30/2015 1529   CL 107 01/30/2015 1529   CO2 27 01/30/2015 1529   BUN 10 01/30/2015 1529   CREATININE 0.97 01/30/2015 1529      Component Value Date/Time   CALCIUM 9.6 01/30/2015 1529   ALKPHOS 93 01/30/2015 1529   AST 20 01/30/2015 1529   ALT 21 01/30/2015 1529   BILITOT 0.4 01/30/2015 1529       Patient Active Problem List   Diagnosis Date Noted  . Cough 01/30/2015  . Elevated blood pressure 01/30/2015  . Family history of colon cancer 01/30/2015  . Routine general medical examination at a health care facility 05/18/2012  . Prostate cancer screening 05/18/2012  . LATERAL EPICONDYLITIS 04/26/2010  . BACK PAIN, LUMBAR 07/07/2008  . NONSPECIFIC MESENTERIC LYMPHADENITIS 06/08/2008  . COLONIC POLYPS  01/27/2008  . TOBACCO USE 01/27/2008  . FOOT PAIN, LEFT 01/27/2008  . SYNCOPE 01/27/2008  . FATIGUE 01/27/2008  . Hyperlipidemia 01/26/2008  . GOUT 01/26/2008  . GERD 01/26/2008  . BENIGN PROSTATIC HYPERTROPHY 01/26/2008   Past Medical History  Diagnosis Date  . GERD (gastroesophageal reflux disease)   . Gout   . Hyperlipidemia   . Benign prostatic hypertrophy    Past Surgical History  Procedure Laterality Date  . Colonoscopy  04/2001 6/09    polyps  . Cholecystectomy    . Appendectomy    . Carpal tunnel release    . Liver hemangioma  9/09   History  Substance Use Topics  . Smoking status: Current Every Day Smoker -- 0.75 packs/day    Types: Cigarettes  . Smokeless tobacco: Not on file  . Alcohol Use: No   Family History  Problem Relation Age of Onset  . Cancer Father   . Heart disease Brother    No Known Allergies No current outpatient prescriptions on file prior to visit.   No current facility-administered medications on file prior to visit.    Review of Systems Review of Systems  Constitutional: Negative for fever, appetite change, fatigue and unexpected weight change.  Eyes: Negative for pain and visual disturbance.  Respiratory: Negative for  and shortness of breath pos for cough worse in am .   Cardiovascular: Negative for cp or palpitations    Gastrointestinal: Negative for nausea, diarrhea and constipation.  Genitourinary: Negative for urgency and frequency.  Skin: Negative for pallor or rash   Neurological: Negative for weakness, light-headedness, numbness and headaches.  Hematological: Negative for adenopathy. Does not bruise/bleed easily.  Psychiatric/Behavioral: Negative for dysphoric mood. The patient is not nervous/anxious.         Objective:   Physical Exam  Constitutional: He appears well-developed and well-nourished. No distress.  HENT:  Head: Normocephalic and atraumatic.  Mouth/Throat: Oropharynx is clear and moist.  Eyes: Conjunctivae  and EOM are normal. Pupils are equal, round, and reactive to light.  Neck: Normal range of motion. Neck supple. No JVD present. Carotid bruit is not present. No thyromegaly present.  Cardiovascular: Normal rate, regular rhythm, normal heart sounds and intact distal pulses.  Exam reveals no gallop.   Pulmonary/Chest: Effort normal and breath sounds normal. No respiratory distress. He has no wheezes. He has no rales.  No crackles  Diffusely distant bs   Abdominal: Soft. Bowel sounds are normal. He exhibits no distension, no abdominal bruit and no mass. There is no tenderness.  Musculoskeletal: He exhibits no edema.  Lymphadenopathy:    He has no cervical adenopathy.  Neurological: He is alert. He has normal reflexes.  Skin: Skin is warm and dry. No rash noted.  Psychiatric: He has a normal mood and affect.          Assessment & Plan:   Problem List Items Addressed This Visit    Cough - Primary    Ongoing  Some imp with zpak  Continues to smoke Reassuring cxr and spirometry  Ref to pulm for further eval  Also enc smoking cessation       Relevant Orders   Ambulatory referral to Pulmonology   Essential hypertension    Disc imp of tx for future health and prev of end organ damage BP: (!) 156/82 mmHg    Px amlodipine 5 mg  Disc poss side eff/will update Rev diet /exercise (DASH diet) and also imp of smoking cessation  Lab reviewed F/u planned       Relevant Medications   amLODipine (NORVASC) 5 MG tablet   TOBACCO USE    Disc in detail risks of smoking and possible outcomes including copd, vascular/ heart disease, cancer , respiratory and sinus infections  Pt voices understanding Pt does not feel ready to quit yet       Relevant Orders   Ambulatory referral to Pulmonology

## 2015-03-06 NOTE — Progress Notes (Signed)
Pre visit review using our clinic review tool, if applicable. No additional management support is needed unless otherwise documented below in the visit note. 

## 2015-03-06 NOTE — Assessment & Plan Note (Signed)
Disc in detail risks of smoking and possible outcomes including copd, vascular/ heart disease, cancer , respiratory and sinus infections  Pt voices understanding Pt does not feel ready to quit yet

## 2015-03-09 ENCOUNTER — Institutional Professional Consult (permissible substitution): Payer: BLUE CROSS/BLUE SHIELD | Admitting: Internal Medicine

## 2015-03-23 ENCOUNTER — Ambulatory Visit (AMBULATORY_SURGERY_CENTER): Payer: Self-pay | Admitting: *Deleted

## 2015-03-23 VITALS — Ht 69.0 in | Wt 193.6 lb

## 2015-03-23 DIAGNOSIS — Z86010 Personal history of colonic polyps: Secondary | ICD-10-CM

## 2015-03-23 MED ORDER — NA SULFATE-K SULFATE-MG SULF 17.5-3.13-1.6 GM/177ML PO SOLN: ORAL | Status: DC

## 2015-03-23 NOTE — Progress Notes (Signed)
No egg or soy allergy  No anesthesia or intubation problems per pt  No diet medications taken  Registered in EMMI   

## 2015-03-24 ENCOUNTER — Encounter: Payer: Self-pay | Admitting: Gastroenterology

## 2015-04-06 ENCOUNTER — Encounter: Payer: Self-pay | Admitting: Gastroenterology

## 2015-04-06 ENCOUNTER — Ambulatory Visit (AMBULATORY_SURGERY_CENTER): Payer: BLUE CROSS/BLUE SHIELD | Admitting: Gastroenterology

## 2015-04-06 VITALS — BP 135/78 | HR 64 | Temp 97.9°F | Resp 22 | Ht 69.0 in | Wt 193.0 lb

## 2015-04-06 DIAGNOSIS — Z800 Family history of malignant neoplasm of digestive organs: Secondary | ICD-10-CM | POA: Diagnosis not present

## 2015-04-06 DIAGNOSIS — Z86010 Personal history of colonic polyps: Secondary | ICD-10-CM

## 2015-04-06 DIAGNOSIS — D123 Benign neoplasm of transverse colon: Secondary | ICD-10-CM | POA: Diagnosis not present

## 2015-04-06 DIAGNOSIS — K5730 Diverticulosis of large intestine without perforation or abscess without bleeding: Secondary | ICD-10-CM

## 2015-04-06 MED ORDER — SODIUM CHLORIDE 0.9 % IV SOLN: 500.0000 mL | INTRAVENOUS | Status: DC

## 2015-04-06 NOTE — Progress Notes (Signed)
Report to PACU, RN, vss, BBS= Clear.  

## 2015-04-06 NOTE — Progress Notes (Signed)
Called to room to assist during endoscopic procedure.  Patient ID and intended procedure confirmed with present staff. Received instructions for my participation in the procedure from the performing physician.  

## 2015-04-06 NOTE — Op Note (Addendum)
[place]  [address] [place], [address]   COLONOSCOPY PROCEDURE REPORT  PATIENT: [name] [name]  MR#: #[id number] BIRTHDATE: Aug 02, 1956 , 52  yrs. old GENDER: male ENDOSCOPIST: Inda Castle, MD REFERRED MA:UQJFH Vernell Morgans, M.D. PROCEDURE DATE:  04/06/2015 PROCEDURE:   Colonoscopy, surveillance and Colonoscopy with snare polypectomy First Screening Colonoscopy - increased.  risk and is 50 yrs.  old or older - No.  Prior Negative Screening - Now for repeat screening.  N/A  History of Adenoma - Now for follow-up colonoscopy & has been > or = to 3 yrs.  N/A  Polyps removed today? Yes ASA CLASS:   Class II INDICATIONS:PH Colon Adenoma.   Family history of colon cancer MEDICATIONS: Monitored anesthesia care and Propofol 200 mg IV  DESCRIPTION OF PROCEDURE:   After the risks benefits and alternatives of the procedure were thoroughly explained, informed consent was obtained.  The digital rectal exam revealed no abnormalities of the rectum.   The LB [id number]  endoscope was introduced through the anus and advanced to the cecum, which was identified by both the appendix and ileocecal valve. No adverse events experienced.   The quality of the prep was (Suprep was used) excellent.  The instrument was then slowly withdrawn as the colon was fully examined. Estimated blood loss is zero unless otherwise noted in this procedure report.      COLON FINDINGS: A sessile polyp measuring 3 mm in size was found in the proximal transverse colon.  A polypectomy was performed with a cold snare.  The resection was complete, the polyp tissue was completely retrieved and sent to histology.   There was mild diverticulosis noted in the transverse colon and descending colon. The examination was otherwise normal.  Retroflexed views revealed no abnormalities. The time to cecum = 0.8 Withdrawal time = 8.8 The scope was withdrawn and the procedure completed. COMPLICATIONS: There  were no immediate complications.  ENDOSCOPIC IMPRESSION: 1.   Sessile polyp was found in the proximal transverse colon; polypectomy was performed with a cold snare 2.   Mild diverticulosis was noted in the transverse colon and descending colon 3.   The examination was otherwise normal  RECOMMENDATIONS: If repeat colonoscopy every 5 years in view of family history of colon cancer  eSigned:  Inda Castle, MD 04/06/2015 2:11 PM Revised: 04/06/2015 2:11 PM  cc:   PATIENT NAME:  [name], [name] MR#: #[id number]

## 2015-04-06 NOTE — Patient Instructions (Addendum)
YOU HAD AN ENDOSCOPIC PROCEDURE TODAY AT [facility]:   Refer to the procedure report that was given to you for any specific questions about what was found during the examination.  If the procedure report does not answer your questions, please call your gastroenterologist to clarify.  If you requested that your care partner not be given the details of your procedure findings, then the procedure report has been included in a sealed envelope for you to review at your convenience later.  YOU SHOULD EXPECT: Some feelings of bloating in the abdomen. Passage of more gas than usual.  Walking can help get rid of the air that was put into your GI tract during the procedure and reduce the bloating. If you had a lower endoscopy (such as a colonoscopy or flexible sigmoidoscopy) you may notice spotting of blood in your stool or on the toilet paper. If you underwent a bowel prep for your procedure, you may not have a normal bowel movement for a few days.  Please Note:  You might notice some irritation and congestion in your nose or some drainage.  This is from the oxygen used during your procedure.  There is no need for concern and it should clear up in a day or so.  SYMPTOMS TO REPORT IMMEDIATELY:   Following lower endoscopy (colonoscopy or flexible sigmoidoscopy):  Excessive amounts of blood in the stool  Significant tenderness or worsening of abdominal pains  Swelling of the abdomen that is new, acute  Fever of 100F or higher   For urgent or emergent issues, a gastroenterologist can be reached at any hour by calling [phone].   DIET: Your first meal following the procedure should be a small meal and then it is ok to progress to your normal diet. Heavy or fried foods are harder to digest and may make you feel nauseous or bloated.  Likewise, meals heavy in dairy and vegetables can increase bloating.  Drink plenty of fluids but you should avoid alcoholic beverages for 24 hours.  Increase  the fiber in your diet.  ACTIVITY:  You should plan to take it easy for the rest of today and you should NOT DRIVE or use heavy machinery until tomorrow (because of the sedation medicines used during the test).    FOLLOW UP: Our staff will call the number listed on your records the next business day following your procedure to check on you and address any questions or concerns that you may have regarding the information given to you following your procedure. If we do not reach you, we will leave a message.  However, if you are feeling well and you are not experiencing any problems, there is no need to return our call.  We will assume that you have returned to your regular daily activities without incident.  Read the handouts given to you by your recovery room nurse.  If any biopsies were taken you will be contacted by phone or by letter within the next 1-3 weeks.  Please call us at [phone] if you have not heard about the biopsies in 3 weeks.    SIGNATURES/CONFIDENTIALITY: You and/or your care partner have signed paperwork which will be entered into your electronic medical record.  These signatures attest to the fact that that the information above on your After Visit Summary has been reviewed and is understood.  Full responsibility of the confidentiality of this discharge information lies with you and/or your care-partner.

## 2015-04-07 ENCOUNTER — Telehealth: Payer: Self-pay | Admitting: *Deleted

## 2015-04-07 NOTE — Telephone Encounter (Signed)
  Follow up Call-  Call back number 04/06/2015  Post procedure Call Back phone  # [phone]  Permission to leave phone message Yes     Patient questions:  Do you have a fever, pain , or abdominal swelling? No. Pain Score  0 *  Have you tolerated food without any problems? Yes.    Have you been able to return to your normal activities? Yes.    Do you have any questions about your discharge instructions: Diet   No. Medications  No. Follow up visit  No.  Do you have questions or concerns about your Care? No.  Actions: * If pain score is 4 or above: No action needed, pain <4.

## 2015-04-14 ENCOUNTER — Encounter: Payer: Self-pay | Admitting: Gastroenterology

## 2015-04-18 ENCOUNTER — Encounter: Payer: Self-pay | Admitting: Family Medicine

## 2015-04-18 ENCOUNTER — Ambulatory Visit (INDEPENDENT_AMBULATORY_CARE_PROVIDER_SITE_OTHER): Payer: BLUE CROSS/BLUE SHIELD | Admitting: Family Medicine

## 2015-04-18 VITALS — BP 132/80 | HR 83 | Temp 98.0°F | Ht 71.0 in | Wt 190.5 lb

## 2015-04-18 DIAGNOSIS — R05 Cough: Secondary | ICD-10-CM | POA: Diagnosis not present

## 2015-04-18 DIAGNOSIS — Z720 Tobacco use: Secondary | ICD-10-CM | POA: Diagnosis not present

## 2015-04-18 DIAGNOSIS — F17200 Nicotine dependence, unspecified, uncomplicated: Secondary | ICD-10-CM

## 2015-04-18 DIAGNOSIS — R059 Cough, unspecified: Secondary | ICD-10-CM

## 2015-04-18 DIAGNOSIS — I10 Essential (primary) hypertension: Secondary | ICD-10-CM

## 2015-04-18 NOTE — Progress Notes (Signed)
Pre visit review using our clinic review tool, if applicable. No additional management support is needed unless otherwise documented below in the visit note. 

## 2015-04-18 NOTE — Patient Instructions (Signed)
Please continue current medicines  BP is improved  Also try to cut the processed foods/ and drink enough water  Think about quitting smoking  If you want to try nexium or prilosec to see if it helps cough - give it 2 weeks (if not helpful- stop it)  If no improvement- re schedule with pulmonary Take care of yourself Schedule physical after May 9th

## 2015-04-18 NOTE — Progress Notes (Signed)
Subjective:    Patient ID: [name] [name], male    DOB: [birth year], 59 y.o.   MRN: [id number]  HPI Here for f/u of HTN    Feeling pretty good   bp is stable today - last visit we added norvasc 5 mg and bp did improve (was 135/78 No cp or palpitations or headaches or edema  No side effects to medicines  BP Readings from Last 3 Encounters:  04/18/15 140/82  04/06/15 135/78  03/06/15 156/82   no difference in diet or exercise   Smoking is the same  Cough and breathing is about the same  He had to cancel appt with pulm due to a scheduling conflict   Wondered if trying nexium or other ppi would help cough   Still smoking the same  No quit ate yet  Not ready yet      Had a colonosc- adenomatous polyps -will re check in 5 y    Patient Active Problem List   Diagnosis Date Noted  . Cough 01/30/2015  . Essential hypertension 01/30/2015  . Family history of colon cancer 01/30/2015  . Routine general medical examination at a health care facility 05/18/2012  . Prostate cancer screening 05/18/2012  . LATERAL EPICONDYLITIS 04/26/2010  . BACK PAIN, LUMBAR 07/07/2008  . NONSPECIFIC MESENTERIC LYMPHADENITIS 06/08/2008  . COLONIC POLYPS 01/27/2008  . TOBACCO USE 01/27/2008  . FOOT PAIN, LEFT 01/27/2008  . SYNCOPE 01/27/2008  . FATIGUE 01/27/2008  . Hyperlipidemia 01/26/2008  . GOUT 01/26/2008  . GERD 01/26/2008  . BENIGN PROSTATIC HYPERTROPHY 01/26/2008   Past Medical History  Diagnosis Date  . GERD (gastroesophageal reflux disease)   . Gout   . Benign prostatic hypertrophy   . Hyperlipidemia     no meds  . Hypertension    Past Surgical History  Procedure Laterality Date  . Colonoscopy  04/2001 6/09    polyps  . Cholecystectomy    . Appendectomy    . Carpal tunnel release    . Liver hemangioma  9/09   History  Substance Use Topics  . Smoking status: Current Every Day Smoker -- 0.75 packs/day    Types: Cigarettes  . Smokeless tobacco: Never Used  . Alcohol  Use: No   Family History  Problem Relation Age of Onset  . Cancer Father   . Colon cancer Father   . Heart disease Brother   . Esophageal cancer Neg Hx   . Stomach cancer Neg Hx   . Rectal cancer Neg Hx    No Known Allergies Current Outpatient Prescriptions on File Prior to Visit  Medication Sig Dispense Refill  . amLODipine (NORVASC) 5 MG tablet Take 1 tablet (5 mg total) by mouth daily. 30 tablet 3  . IBUPROFEN PO Take by mouth as needed.     No current facility-administered medications on file prior to visit.    Review of Systems Review of Systems  Constitutional: Negative for fever, appetite change, fatigue and unexpected weight change.  Eyes: Negative for pain and visual disturbance.  Respiratory: Negative for shortness of breath.   Cardiovascular: Negative for cp or palpitations    Gastrointestinal: Negative for nausea, diarrhea and constipation. neg for heartburn Genitourinary: Negative for urgency and frequency.  Skin: Negative for pallor or rash   Neurological: Negative for weakness, light-headedness, numbness and headaches.  Hematological: Negative for adenopathy. Does not bruise/bleed easily.  Psychiatric/Behavioral: Negative for dysphoric mood. The patient is not nervous/anxious.         Objective:  Physical Exam  Constitutional: He appears well-developed and well-nourished. No distress.  Well appearing   HENT:  Head: Normocephalic and atraumatic.  Mouth/Throat: Oropharynx is clear and moist.  Eyes: Conjunctivae and EOM are normal. Pupils are equal, round, and reactive to light.  Neck: Normal range of motion. Neck supple. No JVD present. Carotid bruit is not present. No thyromegaly present.  Cardiovascular: Normal rate, regular rhythm, normal heart sounds and intact distal pulses.  Exam reveals no gallop.   Pulmonary/Chest: Effort normal and breath sounds normal. No respiratory distress. He has no wheezes. He has no rales.  No crackles  Diffusely distant bs    Abdominal: Soft. Bowel sounds are normal. He exhibits no distension, no abdominal bruit and no mass. There is no tenderness.  Musculoskeletal: He exhibits no edema.  Lymphadenopathy:    He has no cervical adenopathy.  Neurological: He is alert. He has normal reflexes.  Skin: Skin is warm and dry. No rash noted.  Very tanned  Psychiatric: He has a normal mood and affect.          Assessment & Plan:   Problem List Items Addressed This Visit    Cough    Ongoing / improved  Pt is not ready to quit smoking He cancelled his pulm appt due to a schedule conflict  Wants to try PPI for 2 wk otc (although no heartburn) Will update with results of that       Essential hypertension - Primary    Improved with amlodipine (even better outside the office) No side eff bp in fair control at this time  BP Readings from Last 1 Encounters:  04/18/15 132/80   No changes needed Disc lifstyle change with low sodium diet and exercise   Enc to quit smoking       TOBACCO USE    Disc in detail risks of smoking and possible outcomes including copd, vascular/ heart disease, cancer , respiratory and sinus infections  Pt voices understanding Pt is not ready to quit yet but wants to  Disc several methods

## 2015-04-19 NOTE — Assessment & Plan Note (Addendum)
Improved with amlodipine (even better outside the office) No side eff bp in fair control at this time  BP Readings from Last 1 Encounters:  04/18/15 132/80   No changes needed Disc lifstyle change with low sodium diet and exercise   Enc to quit smoking

## 2015-04-19 NOTE — Assessment & Plan Note (Signed)
Ongoing / improved  Pt is not ready to quit smoking He cancelled his pulm appt due to a schedule conflict  Wants to try PPI for 2 wk otc (although no heartburn) Will update with results of that

## 2015-04-19 NOTE — Assessment & Plan Note (Signed)
Disc in detail risks of smoking and possible outcomes including copd, vascular/ heart disease, cancer , respiratory and sinus infections  Pt voices understanding Pt is not ready to quit yet but wants to  Disc several methods

## 2015-04-20 ENCOUNTER — Institutional Professional Consult (permissible substitution): Payer: BLUE CROSS/BLUE SHIELD | Admitting: Internal Medicine

## 2015-06-02 ENCOUNTER — Ambulatory Visit (INDEPENDENT_AMBULATORY_CARE_PROVIDER_SITE_OTHER): Payer: BLUE CROSS/BLUE SHIELD | Admitting: Internal Medicine

## 2015-06-02 ENCOUNTER — Encounter: Payer: Self-pay | Admitting: Internal Medicine

## 2015-06-02 ENCOUNTER — Ambulatory Visit (INDEPENDENT_AMBULATORY_CARE_PROVIDER_SITE_OTHER)
Admission: RE | Admit: 2015-06-02 | Discharge: 2015-06-02 | Disposition: A | Discharge status: Home / Self Care | Payer: BLUE CROSS/BLUE SHIELD | Source: Ambulatory Visit | Attending: Internal Medicine | Admitting: Internal Medicine

## 2015-06-02 VITALS — BP 120/80 | HR 93 | Ht 69.0 in | Wt 189.0 lb

## 2015-06-02 DIAGNOSIS — R05 Cough: Secondary | ICD-10-CM

## 2015-06-02 DIAGNOSIS — F17210 Nicotine dependence, cigarettes, uncomplicated: Secondary | ICD-10-CM

## 2015-06-02 DIAGNOSIS — R059 Cough, unspecified: Secondary | ICD-10-CM

## 2015-06-02 DIAGNOSIS — Z720 Tobacco use: Secondary | ICD-10-CM | POA: Diagnosis not present

## 2015-06-02 DIAGNOSIS — R0789 Other chest pain: Secondary | ICD-10-CM

## 2015-06-02 MED ORDER — FAMOTIDINE 20 MG PO TABS: ORAL_TABLET | ORAL | Status: DC

## 2015-06-02 MED ORDER — TRAMADOL HCL 50 MG PO TABS: ORAL_TABLET | ORAL | Status: DC

## 2015-06-02 MED ORDER — PREDNISONE 10 MG PO TABS: ORAL_TABLET | ORAL | Status: DC

## 2015-06-02 NOTE — Progress Notes (Signed)
   Subjective:    Patient ID: [name] [name], male    DOB: 05/09/1956,     MRN: [id number]  HPI  77 yowm active smoker with cough onset was early 2016 assoc with RUpper chest pain referred by Dr Glori Bickers to pulmonary clinic 06/02/2015    06/02/2015 1st [facility] office visit/ Lian Pounds   Chief Complaint  Patient presents with  . Pulmonary Consult    Referred by Dr. Glori Bickers. Pt c/o cough x several months- prod with clear sputum and worse first thing the am. He states that he feels "burning" in his chest and "breathing wakes me up in the night".   onset was insidious./ pattern is persistent daily cough esp in am but doesn't wake him up. nexium seems to help the most  Not sob unless coughing and doesn't cough much during the day at all  Cp started well after coughing and is vaguely pleuritic/ R parasternal.  No obvious day to day or daytime variability or assoc  chest tightness, subjective wheeze or overt sinus   symptoms. No unusual exp hx or h/o childhood pna/ asthma or knowledge of premature birth.  Sleeping ok without nocturnal  exacerbation  of respiratory  c/o's or need for noct saba. Also denies any obvious fluctuation of symptoms with weather or environmental changes or other aggravating or alleviating factors except as outlined above   Current Medications, Allergies, Complete Past Medical History, Past Surgical History, Family History, and Social History were reviewed in [employer] record.            Review of Systems  Constitutional: Negative for fever, chills, activity change, appetite change and unexpected weight change.  HENT: Negative for congestion, dental problem, postnasal drip, rhinorrhea, sneezing, sore throat, trouble swallowing and voice change.   Eyes: Negative for visual disturbance.  Respiratory: Positive for cough and shortness of breath. Negative for choking.   Cardiovascular: Negative for chest pain and leg swelling.  Gastrointestinal:  Negative for nausea, vomiting and abdominal pain.  Genitourinary: Negative for difficulty urinating.       Acid heartburn  Musculoskeletal: Negative for arthralgias.  Skin: Negative for rash.  Psychiatric/Behavioral: Negative for behavioral problems and confusion.       Objective:   Physical Exam  amb wm nad  Wt Readings from Last 3 Encounters:  06/02/15 189 lb (85.73 kg)  04/18/15 190 lb 8 oz (86.41 kg)  04/06/15 193 lb (87.544 kg)    Vital signs reviewed   HEENT: nl dentition, turbinates, and orophanx. Nl external ear canals without cough reflex   NECK :  without JVD/Nodes/TM/ nl carotid upstrokes bilaterally   LUNGS: no acc muscle use, clear to A and P bilaterally without cough on insp or exp maneuvers   CV:  RRR  no s3 or murmur or increase in P2, no edema   ABD:  soft and nontender with nl excursion in the supine position. No bruits or organomegaly, bowel sounds nl  MS:  warm without deformities, calf tenderness, cyanosis or clubbing  SKIN: warm and dry without lesions    NEURO:  alert, approp, no deficits     CXR PA and Lateral:   06/02/2015 :     I personally reviewed images and agree with radiology impression as follows:    No acute cardiopulmonary disease. Chest stable from 01/30/2015     Assessment & Plan:

## 2015-06-02 NOTE — Progress Notes (Signed)
Quick Note:  Spoke with pt and notified of results per Dr. Wert. Pt verbalized understanding and denied any questions.  ______ 

## 2015-06-02 NOTE — Patient Instructions (Signed)
Please remember to go to the   x-ray department downstairs for your tests - we will call you with the results when they are available.  The key to effective treatment for your cough is eliminating the non-stop cycle of cough you're stuck in long enough to let your airway heal completely and then see if there is anything still making you cough once you stop the cough suppression, but this should take no more than 5 days to figure out  First take mucinex dm 1200 every 12 hours and supplement if needed with  tramadol 50 mg up to 2 every 4 hours to suppress the urge to cough at all or even clear your throat. Swallowing water or using ice chips/non mint and menthol containing candies (such as lifesavers or sugarless jolly ranchers) are also effective.  You should rest your voice and avoid activities that you know make you cough.  Once you have eliminated the cough for 3 straight days try reducing the tramadol first,  then the mucinex dm     Prednisone 10 mg take  4 each am x 2 days,   2 each am x 2 days,  1 each am x 2 days and stop (this is to eliminate allergies and inflammation from coughing)  Nexium 20 mg x 2 Take 30-60 min before first meal of the day and Pepcid 20 mg one bedtime plus chlorpheniramine 4 mg x 1- 2 at bedtime (both available over the counter)  until cough is completely gone for at least a week without the need for cough suppression  GERD (REFLUX)  is an extremely common cause of respiratory symptoms, many times with no significant heartburn at all.    It can be treated with medication, but also with lifestyle changes including avoidance of late meals, excessive alcohol, smoking cessation, and avoid fatty foods, chocolate, peppermint, colas, red wine, and acidic juices such as orange juice.  NO MINT OR MENTHOL PRODUCTS SO NO COUGH DROPS  USE HARD CANDY INSTEAD (jolley ranchers or Stover's or Lifesavers (all available in sugarless versions) NO OIL BASED VITAMINS - use powdered  substitutes.  If not improved after you try the above call [facility] [phone] for CT chest and sinus

## 2015-06-03 ENCOUNTER — Encounter: Payer: Self-pay | Admitting: Internal Medicine

## 2015-06-03 DIAGNOSIS — R0789 Other chest pain: Secondary | ICD-10-CM | POA: Insufficient documentation

## 2015-06-03 NOTE — Assessment & Plan Note (Signed)
The most common causes of chronic cough in immunocompetent adults include the following: upper airway cough syndrome (UACS), previously referred to as postnasal drip syndrome (PNDS), which is caused by variety of rhinosinus conditions; (2) asthma; (3) GERD; (4) chronic bronchitis from cigarette smoking or other inhaled environmental irritants; (5) nonasthmatic eosinophilic bronchitis; and (6) bronchiectasis.   These conditions, singly or in combination, have accounted for up to 94% of the causes of chronic cough in prospective studies.   Other conditions have constituted no >6% of the causes in prospective studies These have included bronchogenic carcinoma, chronic interstitial pneumonia, sarcoidosis, left ventricular failure, ACEI-induced cough, and aspiration from a condition associated with pharyngeal dysfunction.    Chronic cough is often simultaneously caused by more than one condition. A single cause has been found from 38 to 82% of the time, multiple causes from 18 to 62%. Multiply caused cough has been the result of three diseases up to 42% of the time.       Of the three most common causes of chronic cough, only one (GERD)  can actually cause the other two (asthma and post nasal drip syndrome)  and perpetuate the cylce of cough inducing airway trauma, inflammation, heightened sensitivity to reflux which is prompted by the cough itself via a cyclical mechanism.    This may partially respond to steroids and look like asthma and post nasal drainage but never erradicated completely unless the cough and the secondary reflux are eliminated, preferably both at the same time.  While not intuitively obvious, many patients with chronic low grade reflux do not cough until there is a secondary insult that disturbs the protective epithelial barrier and exposes sensitive nerve endings.  This can be viral or direct physical injury such as with an endotracheal tube.   The point is that once this occurs, it is  difficult to eliminate using anything but a maximally effective acid suppression regimen at least in the short run, accompanied by an appropriate diet to address non acid GERD.   rec max rx for gerd/ cyclical cough then proceed with sinus/ chest ct next step   Explained to pt The standardized cough guidelines published in Chest by Lissa Morales in 2006 are still the best available and consist of a multiple step process (up to 12!) , not a single office visit,  and are intended  to address this problem logically,  with an alogrithm dependent on response to empiric treatment at  each progressive step  to determine a specific diagnosis with  minimal addtional testing needed. Therefore if adherence is an issue or can't be accurately verified,  it's very unlikely the standard evaluation and treatment will be successful here.    Furthermore, response to therapy (other than acute cough suppression, which should only be used short term with avoidance of narcotic containing cough syrups if possible), can be a gradual process for which the patient may perceive immediate benefit.  Unlike going to an eye doctor where the best perscription is almost always the first one and is immediately effective, this is almost never the case in the management of chronic cough syndromes. Therefore the patient needs to commit up front to consistently adhere to recommendations  for up to 6 weeks of therapy directed at the likely underlying problem(s) before the response can be reasonably evaluated.   Total time = 37m review case with pt/ discussion/ counseling/ giving and going over instructions (see avs)

## 2015-06-03 NOTE — Assessment & Plan Note (Addendum)

## 2015-06-03 NOTE — Assessment & Plan Note (Signed)
Likely from severe coughing > rx by eliminating the cough / CT chest next if not better

## 2015-06-30 ENCOUNTER — Telehealth: Payer: Self-pay | Admitting: Internal Medicine

## 2015-06-30 DIAGNOSIS — R05 Cough: Secondary | ICD-10-CM

## 2015-06-30 DIAGNOSIS — R053 Chronic cough: Secondary | ICD-10-CM

## 2015-06-30 NOTE — Telephone Encounter (Signed)
Patient calling to request CT scan of lungs.  Patient says that he is not feeling any better and wants to have his lungs checked to make sure there is no cancer or anything. Patient just wants reassurance.  ---------------- Last OV: If not improved after you try the above call [facility] [phone] for CT chest and sinus  -------------- Dr. Melvyn Novas, ok to order CT?

## 2015-06-30 NOTE — Telephone Encounter (Signed)
Ct chest no contrast and sinus CT dx chronic cough

## 2015-06-30 NOTE — Telephone Encounter (Signed)
Orders entered. Patient notified. Nothing further needed.

## 2015-07-06 ENCOUNTER — Other Ambulatory Visit: Payer: Self-pay | Admitting: Internal Medicine

## 2015-07-06 ENCOUNTER — Ambulatory Visit (INDEPENDENT_AMBULATORY_CARE_PROVIDER_SITE_OTHER)
Admission: RE | Admit: 2015-07-06 | Discharge: 2015-07-06 | Disposition: A | Discharge status: Home / Self Care | Payer: BLUE CROSS/BLUE SHIELD | Source: Ambulatory Visit | Attending: Internal Medicine | Admitting: Internal Medicine

## 2015-07-06 DIAGNOSIS — R05 Cough: Secondary | ICD-10-CM

## 2015-07-06 DIAGNOSIS — F17210 Nicotine dependence, cigarettes, uncomplicated: Secondary | ICD-10-CM

## 2015-07-06 DIAGNOSIS — R059 Cough, unspecified: Secondary | ICD-10-CM

## 2015-07-06 DIAGNOSIS — R053 Chronic cough: Secondary | ICD-10-CM

## 2015-07-06 NOTE — Progress Notes (Signed)
Quick Note:  Spoke with pt and notified of results per Dr. Wert. Pt verbalized understanding and denied any questions.  ______ 

## 2015-07-10 ENCOUNTER — Other Ambulatory Visit: Payer: Self-pay | Admitting: Family Medicine

## 2015-08-24 ENCOUNTER — Ambulatory Visit: Payer: BLUE CROSS/BLUE SHIELD | Admitting: Internal Medicine

## 2015-11-14 ENCOUNTER — Ambulatory Visit (INDEPENDENT_AMBULATORY_CARE_PROVIDER_SITE_OTHER): Payer: BLUE CROSS/BLUE SHIELD | Admitting: Family Medicine

## 2015-11-14 ENCOUNTER — Encounter: Payer: Self-pay | Admitting: Family Medicine

## 2015-11-14 VITALS — BP 140/80 | HR 82 | Temp 97.9°F | Ht 69.0 in | Wt 191.8 lb

## 2015-11-14 DIAGNOSIS — R32 Unspecified urinary incontinence: Secondary | ICD-10-CM

## 2015-11-14 DIAGNOSIS — R350 Frequency of micturition: Secondary | ICD-10-CM

## 2015-11-14 LAB — POC URINALSYSI DIPSTICK (AUTOMATED)
Bilirubin, UA: NEGATIVE
Blood, UA: 25
Glucose, UA: NEGATIVE
KETONES UA: NEGATIVE
Leukocytes, UA: NEGATIVE
Nitrite, UA: NEGATIVE
SPEC GRAV UA: 1.02
Urobilinogen, UA: 0.2
pH, UA: 6.5

## 2015-11-14 MED ORDER — TAMSULOSIN HCL 0.4 MG PO CAPS: 0.4000 mg | ORAL_CAPSULE | Freq: Every day | ORAL | Status: DC

## 2015-11-14 NOTE — Progress Notes (Signed)
Pre visit review using our clinic review tool, if applicable. No additional management support is needed unless otherwise documented below in the visit note. 

## 2015-11-14 NOTE — Progress Notes (Signed)
Subjective:    Patient ID: [name] [name], male    DOB: February 26, 1956, 60 y.o.   MRN: [id number]  HPI Here with urinary symptoms   2 weeks  Started with incontinence - about 10 times per day - usually just some dribbling (no problems at night)  Now feels like urinary pressure all the time but it is hard to force urine out  Last night - he was also constipated  Then he vomited  Then had some pain in R flank - that eased up this am  Not as bad as a kidney stone   Took ibuprofen and drank water - eased off at 6 am   No fever   Had small BM last night   Has not noticed any blood in his urine No dysuria  Does feel like he has to urinate all the time  Results for orders placed or performed in visit on 11/14/15  POCT Urinalysis Dipstick (Automated)  Result Value Ref Range   Color, UA Yellow    Clarity, UA Clear    Glucose, UA Neg.    Bilirubin, UA Neg.    Ketones, UA Neg.    Spec Grav, UA 1.020    Blood, UA 25 Ery/uL    pH, UA 6.5    Protein, UA 30 mg/dL    Urobilinogen, UA 0.2    Nitrite, UA Neg.    Leukocytes, UA Negative Negative    Never had a bladder problem before  Still smokes the same No fam hx of bladder or prostate cancer   Lab Results  Component Value Date   PSA 1.10 01/30/2015   PSA 1.01 05/18/2012   PSA 0.93 04/26/2010    Has seen urology in the past for enlarged prostate   Patient Active Problem List   Diagnosis Date Noted  . Chest pain, musculoskeletal 06/03/2015  . Cough 01/30/2015  . Essential hypertension 01/30/2015  . Family history of colon cancer 01/30/2015  . Routine general medical examination at a health care facility 05/18/2012  . Prostate cancer screening 05/18/2012  . LATERAL EPICONDYLITIS 04/26/2010  . BACK PAIN, LUMBAR 07/07/2008  . NONSPECIFIC MESENTERIC LYMPHADENITIS 06/08/2008  . COLONIC POLYPS 01/27/2008  . Cigarette smoker 01/27/2008  . FOOT PAIN, LEFT 01/27/2008  . SYNCOPE 01/27/2008  . FATIGUE 01/27/2008  .  Hyperlipidemia 01/26/2008  . GOUT 01/26/2008  . GERD 01/26/2008  . BENIGN PROSTATIC HYPERTROPHY 01/26/2008   Past Medical History  Diagnosis Date  . GERD (gastroesophageal reflux disease)   . Gout   . Benign prostatic hypertrophy   . Hyperlipidemia     no meds  . Hypertension    Past Surgical History  Procedure Laterality Date  . Colonoscopy  04/2001 6/09    polyps  . Cholecystectomy    . Appendectomy    . Carpal tunnel release    . Liver hemangioma  9/09   Social History  Substance Use Topics  . Smoking status: Current Every Day Smoker -- 1.00 packs/day for 44 years    Types: Cigarettes  . Smokeless tobacco: Never Used  . Alcohol Use: No   Family History  Problem Relation Age of Onset  . Cancer Father   . Colon cancer Father   . Heart disease Brother   . Esophageal cancer Neg Hx   . Stomach cancer Neg Hx   . Rectal cancer Neg Hx    No Known Allergies Current Outpatient Prescriptions on File Prior to Visit  Medication Sig Dispense Refill  .  amLODipine (NORVASC) 5 MG tablet TAKE 1 TABLET (5 MG TOTAL) BY MOUTH DAILY. 30 tablet 5  . IBUPROFEN PO Take by mouth as needed.     No current facility-administered medications on file prior to visit.      Review of Systems Review of Systems  Constitutional: Negative for fever, appetite change, fatigue and unexpected weight change.  Eyes: Negative for pain and visual disturbance.  Respiratory: Negative for cough and shortness of breath.   Cardiovascular: Negative for cp or palpitations    Gastrointestinal: Negative for nausea, diarrhea and constipation.  Genitourinary: pos for urgency and frequency. Neg for gross hematuria  Skin: Negative for pallor or rash   Neurological: Negative for weakness, light-headedness, numbness and headaches.  Hematological: Negative for adenopathy. Does not bruise/bleed easily.  Psychiatric/Behavioral: Negative for dysphoric mood. The patient is not nervous/anxious.         Objective:    Physical Exam  Constitutional: He appears well-developed and well-nourished. No distress.  Well appearing   HENT:  Head: Normocephalic and atraumatic.  Eyes: Conjunctivae and EOM are normal. Pupils are equal, round, and reactive to light.  Neck: Normal range of motion. Neck supple.  Cardiovascular: Normal rate, regular rhythm and normal heart sounds.   Pulmonary/Chest: Effort normal and breath sounds normal. No respiratory distress. He has no wheezes.  Diffusely distant bs   Abdominal: Soft. Bowel sounds are normal. He exhibits no distension and no mass. There is no hepatosplenomegaly. There is tenderness. There is no rigidity, no rebound, no guarding, no CVA tenderness, no tenderness at McBurney's point and negative Murphy's sign.  No cva tenderness  Mild suprapubic tenderness  Musculoskeletal: He exhibits no edema.  Lymphadenopathy:    He has no cervical adenopathy.  Neurological: He is alert.  Skin: No rash noted.  Psychiatric: He has a normal mood and affect.          Assessment & Plan:   Problem List Items Addressed This Visit      Other   Urinary frequency - Primary    With hx of incontinence and then retention  Pt is a smoker - no fam hx of urinary problems or cancer  He has hx of bph  Lab Results  Component Value Date   PSA 1.10 01/30/2015   PSA 1.01 05/18/2012   PSA 0.93 04/26/2010    R flank pain that resolved and constipation that resolved ua pos for blood today - he does not feel like he has a stone Urine sent for culture  flomax px to help with urinary retention-disc poss side eff Ref to urology for further eval       Relevant Orders   Urine culture   Ambulatory referral to Urology    Other Visit Diagnoses    Urinary incontinence, unspecified incontinence type        Relevant Medications    tamsulosin (FLOMAX) 0.4 MG CAPS capsule    Other Relevant Orders    POCT Urinalysis Dipstick (Automated) (Completed)    Ambulatory referral to Urology

## 2015-11-14 NOTE — Patient Instructions (Signed)
Try flomax to see if this makes it easier to empty your bladder  Stop at check out for referral to urology  Drink water instead of green tea  Avoid artificial sweetners

## 2015-11-15 NOTE — Assessment & Plan Note (Addendum)
With hx of incontinence and then retention  Pt is a smoker - no fam hx of urinary problems or cancer  He has hx of bph  Lab Results  Component Value Date   PSA 1.10 01/30/2015   PSA 1.01 05/18/2012   PSA 0.93 04/26/2010    R flank pain that resolved and constipation that resolved ua pos for blood today - he does not feel like he has a stone Urine sent for culture  flomax px to help with urinary retention-disc poss side eff Ref to urology for further eval

## 2015-11-16 LAB — URINE CULTURE
COLONY COUNT: NO GROWTH
ORGANISM ID, BACTERIA: NO GROWTH

## 2016-01-09 ENCOUNTER — Other Ambulatory Visit: Payer: Self-pay | Admitting: Family Medicine

## 2016-01-09 NOTE — Telephone Encounter (Signed)
Please refill times 5 

## 2016-01-09 NOTE — Telephone Encounter (Signed)
Pt had a Possible UTI appt on 11/14/15, but no recent f/u or CPE, please advise

## 2016-01-22 ENCOUNTER — Other Ambulatory Visit: Payer: Self-pay | Admitting: Family Medicine

## 2016-03-01 ENCOUNTER — Ambulatory Visit (INDEPENDENT_AMBULATORY_CARE_PROVIDER_SITE_OTHER): Payer: BLUE CROSS/BLUE SHIELD | Admitting: Family Medicine

## 2016-03-01 ENCOUNTER — Encounter: Payer: Self-pay | Admitting: Family Medicine

## 2016-03-01 VITALS — BP 125/70 | HR 64 | Temp 97.7°F | Wt 197.8 lb

## 2016-03-01 DIAGNOSIS — I10 Essential (primary) hypertension: Secondary | ICD-10-CM

## 2016-03-01 DIAGNOSIS — F17210 Nicotine dependence, cigarettes, uncomplicated: Secondary | ICD-10-CM

## 2016-03-01 DIAGNOSIS — Z720 Tobacco use: Secondary | ICD-10-CM | POA: Diagnosis not present

## 2016-03-01 DIAGNOSIS — R252 Cramp and spasm: Secondary | ICD-10-CM | POA: Diagnosis not present

## 2016-03-01 LAB — LIPID PANEL
CHOL/HDL RATIO: 9
CHOLESTEROL: 181 mg/dL (ref 0–200)
HDL: 21.1 mg/dL — AB (ref >39.00)
NONHDL: 160.1
TRIGLYCERIDES: 313 mg/dL — AB (ref 0.0–149.0)
VLDL: 62.6 mg/dL — AB (ref 0.0–40.0)

## 2016-03-01 LAB — CBC WITH DIFFERENTIAL/PLATELET
BASOS PCT: 0.9 % (ref 0.0–3.0)
Basophils Absolute: 0.1 10*3/uL (ref 0.0–0.1)
EOS ABS: 0.2 10*3/uL (ref 0.0–0.7)
EOS PCT: 2.1 % (ref 0.0–5.0)
HCT: 44.7 % (ref 39.0–52.0)
Hemoglobin: 15.2 g/dL (ref 13.0–17.0)
LYMPHS ABS: 2 10*3/uL (ref 0.7–4.0)
Lymphocytes Relative: 25.5 % (ref 12.0–46.0)
MCHC: 33.9 g/dL (ref 30.0–36.0)
MCV: 90.4 fl (ref 78.0–100.0)
MONO ABS: 0.5 10*3/uL (ref 0.1–1.0)
Monocytes Relative: 6.2 % (ref 3.0–12.0)
NEUTROS ABS: 5.1 10*3/uL (ref 1.4–7.7)
NEUTROS PCT: 65.3 % (ref 43.0–77.0)
PLATELETS: 263 10*3/uL (ref 150.0–400.0)
RBC: 4.95 Mil/uL (ref 4.22–5.81)
RDW: 13.3 % (ref 11.5–15.5)
WBC: 7.8 10*3/uL (ref 4.0–10.5)

## 2016-03-01 LAB — COMPREHENSIVE METABOLIC PANEL
ALBUMIN: 4 g/dL (ref 3.5–5.2)
ALT: 13 U/L (ref 0–53)
AST: 16 U/L (ref 0–37)
Alkaline Phosphatase: 82 U/L (ref 39–117)
BILIRUBIN TOTAL: 0.2 mg/dL (ref 0.2–1.2)
BUN: 13 mg/dL (ref 6–23)
CHLORIDE: 108 meq/L (ref 96–112)
CO2: 26 meq/L (ref 19–32)
Calcium: 9 mg/dL (ref 8.4–10.5)
Creatinine, Ser: 0.88 mg/dL (ref 0.40–1.50)
GFR: 93.88 mL/min (ref >60.00)
GLUCOSE: 108 mg/dL — AB (ref 70–99)
POTASSIUM: 3.9 meq/L (ref 3.5–5.1)
SODIUM: 140 meq/L (ref 135–145)
Total Protein: 6.7 g/dL (ref 6.0–8.3)

## 2016-03-01 LAB — LDL CHOLESTEROL, DIRECT: Direct LDL: 128 mg/dL

## 2016-03-01 LAB — TSH: TSH: 1.34 u[IU]/mL (ref 0.35–4.50)

## 2016-03-01 MED ORDER — AMLODIPINE BESYLATE 5 MG PO TABS: ORAL_TABLET | ORAL | Status: DC

## 2016-03-01 NOTE — Progress Notes (Signed)
Subjective:    Patient ID: [name] [name], male    DOB: May 26, 1956, 60 y.o.   MRN: [id number]  HPI Here for bp f/u  Has been running high at home 0000000 and [id number] systolic  Thinks his cuff runs high  133/75- here BP: 114/74 mmHg  On our cuff   BP Readings from Last 3 Encounters:  03/01/16 114/74  11/14/15 140/80  06/02/15 120/80    Has been feeling ok  No headaches No cp or sob   Wt is up about 6 lb with bmi 29  Does not drink enough water  Some salty food  Drinks diet green tea   Smoking the same  Not eating healthy - some junk food and no time to eat     Chemistry      Component Value Date/Time   NA 141 01/30/2015 1529   K 4.6 01/30/2015 1529   CL 107 01/30/2015 1529   CO2 27 01/30/2015 1529   BUN 10 01/30/2015 1529   CREATININE 0.97 01/30/2015 1529      Component Value Date/Time   CALCIUM 9.6 01/30/2015 1529   ALKPHOS 93 01/30/2015 1529   AST 20 01/30/2015 1529   ALT 21 01/30/2015 1529   BILITOT 0.4 01/30/2015 1529       Patient Active Problem List   Diagnosis Date Noted  . Muscle cramp 03/01/2016  . Urinary frequency 11/14/2015  . Chest pain, musculoskeletal 06/03/2015  . Cough 01/30/2015  . Essential hypertension 01/30/2015  . Family history of colon cancer 01/30/2015  . Routine general medical examination at a health care facility 05/18/2012  . Prostate cancer screening 05/18/2012  . LATERAL EPICONDYLITIS 04/26/2010  . BACK PAIN, LUMBAR 07/07/2008  . NONSPECIFIC MESENTERIC LYMPHADENITIS 06/08/2008  . COLONIC POLYPS 01/27/2008  . Cigarette smoker 01/27/2008  . FOOT PAIN, LEFT 01/27/2008  . SYNCOPE 01/27/2008  . FATIGUE 01/27/2008  . Hyperlipidemia 01/26/2008  . GOUT 01/26/2008  . GERD 01/26/2008  . BENIGN PROSTATIC HYPERTROPHY 01/26/2008   Past Medical History  Diagnosis Date  . GERD (gastroesophageal reflux disease)   . Gout   . Benign prostatic hypertrophy   . Hyperlipidemia     no meds  . Hypertension    Past Surgical History    Procedure Laterality Date  . Colonoscopy  04/2001 6/09    polyps  . Cholecystectomy    . Appendectomy    . Carpal tunnel release    . Liver hemangioma  9/09   Social History  Substance Use Topics  . Smoking status: Current Every Day Smoker -- 1.00 packs/day for 44 years    Types: Cigarettes  . Smokeless tobacco: Never Used  . Alcohol Use: No   Family History  Problem Relation Age of Onset  . Cancer Father   . Colon cancer Father   . Heart disease Brother   . Esophageal cancer Neg Hx   . Stomach cancer Neg Hx   . Rectal cancer Neg Hx    No Known Allergies Current Outpatient Prescriptions on File Prior to Visit  Medication Sig Dispense Refill  . IBUPROFEN PO Take by mouth as needed.     No current facility-administered medications on file prior to visit.    Review of Systems Review of Systems  Constitutional: Negative for fever, appetite change, fatigue and unexpected weight change.  Eyes: Negative for pain and visual disturbance.  Respiratory: Negative for cough and shortness of breath.   Cardiovascular: Negative for cp or palpitations    Gastrointestinal:  Negative for nausea, diarrhea and constipation.  Genitourinary: Negative for urgency and frequency.  Skin: Negative for pallor or rash   Neurological: Negative for weakness, light-headedness, numbness and headaches. MSK pos for occ muscle cramps   Hematological: Negative for adenopathy. Does not bruise/bleed easily.  Psychiatric/Behavioral: Negative for dysphoric mood. The patient is not nervous/anxious.         Objective:   Physical Exam  Constitutional: He appears well-developed and well-nourished. No distress.  Well appearing  HENT:  Head: Normocephalic and atraumatic.  Nose: Nose normal.  Mouth/Throat: Oropharynx is clear and moist.  Nares are boggy  Eyes: Conjunctivae and EOM are normal. Pupils are equal, round, and reactive to light. Right eye exhibits no discharge. Left eye exhibits no discharge. No  scleral icterus.  Neck: Normal range of motion. Neck supple. No JVD present. Carotid bruit is not present. No thyromegaly present.  Cardiovascular: Normal rate, regular rhythm, normal heart sounds and intact distal pulses.  Exam reveals no gallop.   Pulmonary/Chest: Effort normal and breath sounds normal. No respiratory distress. He has no wheezes. He exhibits no tenderness.  Diffusely distant bs  No wheeze today  Abdominal: Soft. Bowel sounds are normal. He exhibits no distension, no abdominal bruit and no mass. There is no tenderness.  Musculoskeletal: He exhibits no edema or tenderness.  No acute joing changes No myofascial tenderness  Lymphadenopathy:    He has no cervical adenopathy.  Neurological: He is alert. He has normal reflexes. No cranial nerve deficit. He exhibits normal muscle tone. Coordination normal.  Skin: Skin is warm and dry. No rash noted. No erythema. No pallor.  Tanned with solar aging and lentigines   Psychiatric: He has a normal mood and affect.          Assessment & Plan:   Problem List Items Addressed This Visit      Cardiovascular and Mediastinum   Essential hypertension - Primary    bp in fair control at this time  BP Readings from Last 1 Encounters:  03/01/16 125/70   No changes needed Disc lifstyle change with low sodium diet and exercise  Readings here are better than at home- his cuff is inaccurate Lab today  Enc to quit smoking  Disc DASH diet       Relevant Medications   amLODipine (NORVASC) 5 MG tablet   Other Relevant Orders   CBC with Differential/Platelet (Completed)   Comprehensive metabolic panel (Completed)   TSH (Completed)   Lipid panel (Completed)     Other   Muscle cramp    occ pm/ arms and legs Labs incl electrolytes today Disc stretching Disc use of tonic water and mustard       Cigarette smoker    Discussed how this problem influences overall health and the risks it imposes  Reviewed plan for weight loss with  lower calorie diet (via better food choices and also portion control or program like weight watchers) and exercise building up to or more than 30 minutes 5 days per week including some aerobic activity   Pt is not ready to quit Aware he is developing some copd

## 2016-03-01 NOTE — Progress Notes (Signed)
Pre visit review using our clinic review tool, if applicable. No additional management support is needed unless otherwise documented below in the visit note. 

## 2016-03-01 NOTE — Patient Instructions (Signed)
Try to get in more water (swap some green tea out for water)  Try to steer clear of salty foods  Keep thinking about quitting smoking  Take care of yourself

## 2016-03-03 NOTE — Assessment & Plan Note (Signed)
bp in fair control at this time  BP Readings from Last 1 Encounters:  03/01/16 125/70   No changes needed Disc lifstyle change with low sodium diet and exercise  Readings here are better than at home- his cuff is inaccurate Lab today  Enc to quit smoking  Disc DASH diet

## 2016-03-03 NOTE — Assessment & Plan Note (Signed)
occ pm/ arms and legs Labs incl electrolytes today Disc stretching Disc use of tonic water and mustard

## 2016-03-03 NOTE — Assessment & Plan Note (Signed)
Discussed how this problem influences overall health and the risks it imposes  Reviewed plan for weight loss with lower calorie diet (via better food choices and also portion control or program like weight watchers) and exercise building up to or more than 30 minutes 5 days per week including some aerobic activity   Pt is not ready to quit Aware he is developing some copd

## 2016-03-06 ENCOUNTER — Telehealth: Payer: Self-pay | Admitting: *Deleted

## 2016-03-06 MED ORDER — ATORVASTATIN CALCIUM 10 MG PO TABS: 10.0000 mg | ORAL_TABLET | Freq: Every day | ORAL | Status: DC

## 2016-03-06 NOTE — Telephone Encounter (Signed)
Notes Recorded by Modena Nunnery, CMA on 03/05/2016 at 10:12 AM Spoke to [name] [name] and informed him of results and instruction. [name] [name] states that he will "think about it" regarding starting lipid med and contact office back Notes Recorded by Abner Greenspan, MD on 03/03/2016 at 2:25 PM Labs ok but cholesterol panel is not good for a smoker He is at risk of vascular dz Avoid red meat/ fried foods/ egg yolks/ fatty breakfast meats/ butter, cheese and high fat dairy/ and shellfish  (also exercise would help raise HDL) Keep thinking about quitting smoking  I think he would benefit from a statin medicine like lipitor to prevent further problems-please let me know if agreeable

## 2016-03-06 NOTE — Telephone Encounter (Signed)
I sent it in  Alert me if any side effects  Re check fasting lipid /ast/alt in about 6 wk please

## 2016-03-06 NOTE — Telephone Encounter (Signed)
See prev note from lab results. Pt called back and he is okay with starting the lipitor, pt needs Rx sent to [facility]

## 2016-03-07 NOTE — Telephone Encounter (Signed)
Pt notified Rx sent to pharmacy and advise of Dr. Marliss Coots comments. Pt said due to him traveling for his job a lot this month that he decided not to start the med until the end of July when he returns. Pt said once he starts med he will call back to schedule a fasting lab appt 6 weeks after starting meds

## 2016-03-07 NOTE — Telephone Encounter (Signed)
Noted, thanks!

## 2016-10-30 ENCOUNTER — Telehealth: Payer: Self-pay

## 2016-10-30 MED ORDER — ROSUVASTATIN CALCIUM 5 MG PO TABS: 5.0000 mg | ORAL_TABLET | Freq: Every day | ORAL | 11 refills | Status: DC

## 2016-10-30 NOTE — Telephone Encounter (Signed)
[facility]  If muscle pain- try taking every other day  Let me know if you do get any side effects   Check lipid/ast/alt 6 wk after starting (fasting) please

## 2016-10-30 NOTE — Telephone Encounter (Signed)
Pt left v/m wanting to changing lipitor to another substitute med like crestor. Pt having problems with legs bothering him. Last seen 03/01/16. Pt request cb.

## 2016-10-30 NOTE — Telephone Encounter (Signed)
Pt notified Rx sent and advise of Dr. Marliss Coots comments. Lab appt scheduled

## 2016-12-12 ENCOUNTER — Other Ambulatory Visit: Payer: Self-pay | Admitting: Family Medicine

## 2016-12-12 DIAGNOSIS — E785 Hyperlipidemia, unspecified: Secondary | ICD-10-CM

## 2016-12-13 ENCOUNTER — Other Ambulatory Visit (INDEPENDENT_AMBULATORY_CARE_PROVIDER_SITE_OTHER): Payer: BLUE CROSS/BLUE SHIELD

## 2016-12-13 DIAGNOSIS — E785 Hyperlipidemia, unspecified: Secondary | ICD-10-CM | POA: Diagnosis not present

## 2016-12-13 LAB — LIPID PANEL
CHOL/HDL RATIO: 5
Cholesterol: 132 mg/dL (ref 0–200)
HDL: 25.7 mg/dL — ABNORMAL LOW (ref >39.00)
LDL Cholesterol: 79 mg/dL (ref 0–99)
NonHDL: 105.98
Triglycerides: 137 mg/dL (ref 0.0–149.0)
VLDL: 27.4 mg/dL (ref 0.0–40.0)

## 2016-12-13 LAB — ALT: ALT: 16 U/L (ref 0–53)

## 2016-12-13 LAB — AST: AST: 15 U/L (ref 0–37)

## 2016-12-16 ENCOUNTER — Encounter: Payer: Self-pay | Admitting: *Deleted

## 2017-03-07 ENCOUNTER — Other Ambulatory Visit: Payer: Self-pay | Admitting: Family Medicine

## 2017-03-07 NOTE — Telephone Encounter (Signed)
Please schedule summer f/u and refill until then 

## 2017-03-07 NOTE — Telephone Encounter (Signed)
Pt will have to check his schedule and call back to schedule an appt., Rx declined until pt schedules appt but pt said he has a few weeks worth of meds left and he didn't need a refill right now

## 2017-03-07 NOTE — Telephone Encounter (Signed)
Pt hasn't been seen in over a year and no future appts., please advise  

## 2017-04-09 ENCOUNTER — Encounter: Payer: Self-pay | Admitting: Family Medicine

## 2017-04-09 ENCOUNTER — Ambulatory Visit (INDEPENDENT_AMBULATORY_CARE_PROVIDER_SITE_OTHER): Payer: BLUE CROSS/BLUE SHIELD | Admitting: Family Medicine

## 2017-04-09 VITALS — BP 124/62 | HR 78 | Temp 98.2°F | Ht 69.0 in | Wt 196.2 lb

## 2017-04-09 DIAGNOSIS — Z125 Encounter for screening for malignant neoplasm of prostate: Secondary | ICD-10-CM

## 2017-04-09 DIAGNOSIS — F17210 Nicotine dependence, cigarettes, uncomplicated: Secondary | ICD-10-CM | POA: Diagnosis not present

## 2017-04-09 DIAGNOSIS — M545 Low back pain, unspecified: Secondary | ICD-10-CM

## 2017-04-09 DIAGNOSIS — I10 Essential (primary) hypertension: Secondary | ICD-10-CM | POA: Diagnosis not present

## 2017-04-09 DIAGNOSIS — G8929 Other chronic pain: Secondary | ICD-10-CM | POA: Diagnosis not present

## 2017-04-09 DIAGNOSIS — N401 Enlarged prostate with lower urinary tract symptoms: Secondary | ICD-10-CM | POA: Diagnosis not present

## 2017-04-09 DIAGNOSIS — E7800 Pure hypercholesterolemia, unspecified: Secondary | ICD-10-CM | POA: Diagnosis not present

## 2017-04-09 LAB — CBC WITH DIFFERENTIAL/PLATELET
BASOS ABS: 88 {cells}/uL (ref 0–200)
Basophils Relative: 1 %
EOS ABS: 176 {cells}/uL (ref 15–500)
Eosinophils Relative: 2 %
HEMATOCRIT: 43.9 % (ref 38.5–50.0)
HEMOGLOBIN: 14.9 g/dL (ref 13.2–17.1)
LYMPHS ABS: 2728 {cells}/uL (ref 850–3900)
Lymphocytes Relative: 31 %
MCH: 31.4 pg (ref 27.0–33.0)
MCHC: 33.9 g/dL (ref 32.0–36.0)
MCV: 92.4 fL (ref 80.0–100.0)
MONO ABS: 528 {cells}/uL (ref 200–950)
MPV: 9.4 fL (ref 7.5–12.5)
Monocytes Relative: 6 %
NEUTROS PCT: 60 %
Neutro Abs: 5280 cells/uL (ref 1500–7800)
Platelets: 255 10*3/uL (ref 140–400)
RBC: 4.75 MIL/uL (ref 4.20–5.80)
RDW: 13.7 % (ref 11.0–15.0)
WBC: 8.8 10*3/uL (ref 3.8–10.8)

## 2017-04-09 LAB — TSH: TSH: 1.39 mIU/L (ref 0.40–4.50)

## 2017-04-09 MED ORDER — VARENICLINE TARTRATE 0.5 MG X 11 & 1 MG X 42 PO MISC: ORAL | 0 refills | Status: DC

## 2017-04-09 MED ORDER — ROSUVASTATIN CALCIUM 5 MG PO TABS: 5.0000 mg | ORAL_TABLET | Freq: Every day | ORAL | 3 refills | Status: DC

## 2017-04-09 MED ORDER — AMLODIPINE BESYLATE 5 MG PO TABS: ORAL_TABLET | ORAL | 3 refills | Status: DC

## 2017-04-09 MED ORDER — VARENICLINE TARTRATE 1 MG PO TABS: 1.0000 mg | ORAL_TABLET | Freq: Two times a day (BID) | ORAL | 3 refills | Status: DC

## 2017-04-09 NOTE — Progress Notes (Signed)
Subjective:    Patient ID: [name] [name], male    DOB: [birth year], 61 y.o.   MRN: [id number]  HPI Here for f/u of chronic health problems  Doing ok overall  Work is really busy  No plans for retirement    Feeling fine overall other than his lungs   Wt Readings from Last 3 Encounters:  04/09/17 196 lb 4 oz (89 kg)  03/01/16 197 lb 12 oz (89.7 kg)  11/14/15 191 lb 12 oz (87 kg)  goes up and down 5 lb  Does not have a good diet -no motivation to change that  Skips meals / eats what he can fine  28.98 kg/m   Not motivated to exercise  Mows yard and does yard work / cleaning also   bp is stable today  No cp or palpitations or headaches or edema  No side effects to medicines  BP Readings from Last 3 Encounters:  04/09/17 124/62  03/01/16 125/70  11/14/15 140/80      Hx of BPH- doing pretty good  occ has painful erections  He has seen urology in the past for blood in urine -work up was ok and he had a bad experience with the doctor  Then he passed a big kidney stone - and felt much better  No nocturia  No longer needs flomax  Lab Results  Component Value Date   PSA 1.10 01/30/2015   PSA 1.01 05/18/2012   PSA 0.93 04/26/2010   no prostate cancer in the family    Hyperlipidemia On crestor 5 mg - doing well with crestor  Lab Results  Component Value Date   CHOL 132 12/13/2016   HDL 25.70 (L) 12/13/2016   LDLCALC 79 12/13/2016   LDLDIRECT 128.0 03/01/2016   TRIG 137.0 12/13/2016   CHOLHDL 5 12/13/2016   Smoking status -smokes more when work is very busy  Less than 1ppd  Knows he has to quit  He tried patches and he still smoked with them /also tried the gum  Smokes for stress and boredom    Copd-has seen pulmonary  Some problems when he lies down - sob/cough (worse in the am)  Never went back to pulmonary  No inhalers a little wheeze at night  Does not want an inhaler   Patient Active Problem List   Diagnosis Date Noted  . Muscle cramp 03/01/2016    . Cough 01/30/2015  . Essential hypertension 01/30/2015  . Family history of colon cancer 01/30/2015  . Routine general medical examination at a health care facility 05/18/2012  . Prostate cancer screening 05/18/2012  . LATERAL EPICONDYLITIS 04/26/2010  . BACK PAIN, LUMBAR 07/07/2008  . NONSPECIFIC MESENTERIC LYMPHADENITIS 06/08/2008  . COLONIC POLYPS 01/27/2008  . Cigarette smoker 01/27/2008  . FOOT PAIN, LEFT 01/27/2008  . SYNCOPE 01/27/2008  . FATIGUE 01/27/2008  . Hyperlipidemia 01/26/2008  . GOUT 01/26/2008  . GERD 01/26/2008  . BPH (benign prostatic hyperplasia) 01/26/2008   Past Medical History:  Diagnosis Date  . Benign prostatic hypertrophy   . GERD (gastroesophageal reflux disease)   . Gout   . Hyperlipidemia    no meds  . Hypertension    Past Surgical History:  Procedure Laterality Date  . APPENDECTOMY    . CARPAL TUNNEL RELEASE    . CHOLECYSTECTOMY    . COLONOSCOPY  04/2001 6/09   polyps  . liver hemangioma  9/09   Social History  Substance Use Topics  . Smoking status: Current Every Day  Smoker    Packs/day: 1.00    Years: 44.00    Types: Cigarettes  . Smokeless tobacco: Never Used  . Alcohol use No   Family History  Problem Relation Age of Onset  . Cancer Father   . Colon cancer Father   . Heart disease Brother   . Esophageal cancer Neg Hx   . Stomach cancer Neg Hx   . Rectal cancer Neg Hx    Allergies  Allergen Reactions  . Lipitor [Atorvastatin]     Leg pain   Current Outpatient Prescriptions on File Prior to Visit  Medication Sig Dispense Refill  . IBUPROFEN PO Take by mouth as needed.     No current facility-administered medications on file prior to visit.       Review of Systems Review of Systems  Constitutional: Negative for fever, appetite change, fatigue and unexpected weight change.  Eyes: Negative for pain and visual disturbance.  Respiratory: pos for cough and sob/ worse in the am (pt does not desire tx)  Cardiovascular:  Negative for cp or palpitations    Gastrointestinal: Negative for nausea, diarrhea and constipation.  Genitourinary: Negative for urgency and frequency.  Skin: Negative for pallor or rash   Neurological: Negative for weakness, light-headedness, numbness and headaches.  Hematological: Negative for adenopathy. Does not bruise/bleed easily.  Psychiatric/Behavioral: Negative for dysphoric mood. The patient is not nervous/anxious.         Objective:   Physical Exam  Constitutional: He appears well-developed and well-nourished. No distress.  Well appearing   HENT:  Head: Normocephalic and atraumatic.  Mouth/Throat: Oropharynx is clear and moist.  Eyes: Pupils are equal, round, and reactive to light. Conjunctivae and EOM are normal.  Neck: Normal range of motion. Neck supple. No JVD present. Carotid bruit is not present. No thyromegaly present.  Cardiovascular: Normal rate, regular rhythm, normal heart sounds and intact distal pulses.  Exam reveals no gallop.   Pulmonary/Chest: Effort normal. No respiratory distress. He has wheezes. He has no rales. He exhibits no tenderness.  No crackles  Diffusely distant bs  Wheeze on forced expiration   Abdominal: Soft. Bowel sounds are normal. He exhibits no distension, no abdominal bruit and no mass. There is no tenderness.  Musculoskeletal: He exhibits no edema.  Lymphadenopathy:    He has no cervical adenopathy.  Neurological: He is alert. He has normal reflexes. No cranial nerve deficit. He exhibits normal muscle tone. Coordination normal.  Skin: Skin is warm and dry. No rash noted. No pallor.  Very tanned   Psychiatric: He has a normal mood and affect.          Assessment & Plan:   Problem List Items Addressed This Visit      Cardiovascular and Mediastinum   Essential hypertension - Primary    bp in fair control at this time  BP Readings from Last 1 Encounters:  04/09/17 124/62   No changes needed Disc lifstyle change with low  sodium diet and exercise  Labs today  Enc smoking cessation       Relevant Medications   amLODipine (NORVASC) 5 MG tablet   rosuvastatin (CRESTOR) 5 MG tablet   Other Relevant Orders   CBC with Differential/Platelet (Completed)   Comprehensive metabolic panel (Completed)   Lipid panel (Completed)   TSH (Completed)     Genitourinary   BPH (benign prostatic hyperplasia)    No change in symptoms  Thinks he did pass a kidney stone  Does not want to return  to urology at this time  psa today      Relevant Orders   PSA (Completed)     Other   BACK PAIN, LUMBAR    Chronic  Printed back exercises/stretches to work on today      Cigarette smoker    With copd (see pulmonary eval)  He is almost ready to quit Disc opt for help  Px chantix starter pack and mt dose -printed so he can check on price and choose his time  Disc poss side eff incl mood change and bad dreams Will stop med if intolerable side eff      Hyperlipidemia    Disc goals for lipids and reasons to control them Rev labs with pt  (last check) -has low HDL  Rev low sat fat diet in detail  Lipid panel today      Relevant Medications   amLODipine (NORVASC) 5 MG tablet   rosuvastatin (CRESTOR) 5 MG tablet   Other Relevant Orders   Lipid panel (Completed)   Prostate cancer screening    psa today  No family hx

## 2017-04-09 NOTE — Patient Instructions (Addendum)
Lab today for blood pressure and cholesterol and PSA   Keep thinking about quitting smoking  Try chantix and see how you do   Stay active Try to eat better - more fruits and vegetables and lean protein

## 2017-04-10 LAB — COMPREHENSIVE METABOLIC PANEL
ALBUMIN: 4.1 g/dL (ref 3.6–5.1)
ALK PHOS: 92 U/L (ref 40–115)
ALT: 20 U/L (ref 9–46)
AST: 17 U/L (ref 10–35)
BILIRUBIN TOTAL: 0.1 mg/dL — AB (ref 0.2–1.2)
BUN: 12 mg/dL (ref 7–25)
CALCIUM: 8.8 mg/dL (ref 8.6–10.3)
CO2: 22 mmol/L (ref 20–31)
Chloride: 107 mmol/L (ref 98–110)
Creat: 1.01 mg/dL (ref 0.70–1.25)
Glucose, Bld: 77 mg/dL (ref 65–99)
POTASSIUM: 4.3 mmol/L (ref 3.5–5.3)
Sodium: 141 mmol/L (ref 135–146)
Total Protein: 6.5 g/dL (ref 6.1–8.1)

## 2017-04-10 LAB — PSA: PSA: 0.8 ng/mL (ref <=4.0)

## 2017-04-10 LAB — LIPID PANEL
CHOL/HDL RATIO: 7.6 ratio — AB (ref <5.0)
Cholesterol: 159 mg/dL (ref <200)
HDL: 21 mg/dL — AB (ref >40)
LDL CALC: 64 mg/dL (ref <100)
TRIGLYCERIDES: 371 mg/dL — AB (ref <150)
VLDL: 74 mg/dL — ABNORMAL HIGH (ref <30)

## 2017-04-10 NOTE — Assessment & Plan Note (Signed)
psa today  No family hx

## 2017-04-10 NOTE — Assessment & Plan Note (Signed)
Disc goals for lipids and reasons to control them Rev labs with pt  (last check) -has low HDL  Rev low sat fat diet in detail  Lipid panel today

## 2017-04-10 NOTE — Assessment & Plan Note (Signed)
bp in fair control at this time  BP Readings from Last 1 Encounters:  04/09/17 124/62   No changes needed Disc lifstyle change with low sodium diet and exercise  Labs today  Enc smoking cessation

## 2017-04-10 NOTE — Assessment & Plan Note (Signed)
No change in symptoms  Thinks he did pass a kidney stone  Does not want to return to urology at this time  psa today

## 2017-04-10 NOTE — Assessment & Plan Note (Signed)
With copd (see pulmonary eval)  He is almost ready to quit Disc opt for help  Px chantix starter pack and mt dose -printed so he can check on price and choose his time  Disc poss side eff incl mood change and bad dreams Will stop med if intolerable side eff

## 2017-04-10 NOTE — Assessment & Plan Note (Signed)
Chronic  Printed back exercises/stretches to work on today

## 2017-05-20 DIAGNOSIS — M79602 Pain in left arm: Secondary | ICD-10-CM | POA: Diagnosis not present

## 2017-05-20 DIAGNOSIS — S51802A Unspecified open wound of left forearm, initial encounter: Secondary | ICD-10-CM | POA: Diagnosis not present

## 2017-05-20 DIAGNOSIS — F17200 Nicotine dependence, unspecified, uncomplicated: Secondary | ICD-10-CM | POA: Diagnosis not present

## 2017-05-20 DIAGNOSIS — Z23 Encounter for immunization: Secondary | ICD-10-CM | POA: Diagnosis not present

## 2017-05-20 DIAGNOSIS — Z87891 Personal history of nicotine dependence: Secondary | ICD-10-CM | POA: Diagnosis not present

## 2017-06-21 DIAGNOSIS — H9203 Otalgia, bilateral: Secondary | ICD-10-CM | POA: Diagnosis not present

## 2017-07-07 DIAGNOSIS — H9202 Otalgia, left ear: Secondary | ICD-10-CM | POA: Diagnosis not present

## 2017-07-07 DIAGNOSIS — N486 Induration penis plastica: Secondary | ICD-10-CM | POA: Diagnosis not present

## 2017-07-25 DIAGNOSIS — N486 Induration penis plastica: Secondary | ICD-10-CM | POA: Diagnosis not present

## 2017-11-07 ENCOUNTER — Encounter: Payer: Self-pay | Admitting: Primary Care

## 2017-11-07 ENCOUNTER — Ambulatory Visit (INDEPENDENT_AMBULATORY_CARE_PROVIDER_SITE_OTHER)
Admission: RE | Admit: 2017-11-07 | Discharge: 2017-11-07 | Disposition: A | Discharge status: Home / Self Care | Payer: BLUE CROSS/BLUE SHIELD | Source: Ambulatory Visit | Attending: Primary Care | Admitting: Primary Care

## 2017-11-07 ENCOUNTER — Ambulatory Visit: Payer: BLUE CROSS/BLUE SHIELD | Admitting: Primary Care

## 2017-11-07 VITALS — BP 146/82 | HR 72 | Temp 97.7°F | Ht 69.0 in | Wt 195.2 lb

## 2017-11-07 DIAGNOSIS — G8929 Other chronic pain: Secondary | ICD-10-CM | POA: Diagnosis not present

## 2017-11-07 DIAGNOSIS — M47812 Spondylosis without myelopathy or radiculopathy, cervical region: Secondary | ICD-10-CM | POA: Diagnosis not present

## 2017-11-07 DIAGNOSIS — M25512 Pain in left shoulder: Secondary | ICD-10-CM

## 2017-11-07 MED ORDER — PREDNISONE 10 MG PO TABS: ORAL_TABLET | ORAL | 0 refills | Status: DC

## 2017-11-07 MED ORDER — METHOCARBAMOL 500 MG PO TABS: 500.0000 mg | ORAL_TABLET | Freq: Three times a day (TID) | ORAL | 0 refills | Status: DC | PRN

## 2017-11-07 NOTE — Progress Notes (Signed)
Subjective:    Patient ID: [name] [name], male    DOB: 06/18/1956, 62 y.o.   MRN: [id number]  HPI  Mr. Diantonio is a 62 year old male with a history of hyperlipidemia managed on Crestor, gout who presents today with a chief complaint of shoulder pain.  The pain to his left upper posterior back and left shoulder with radiation of pain down his left upper extremity. His symptoms have been present for the last 6 months. He's also noticed decrease in range of motion to his left upper extremity, especially with posterior abduction, lateral abduction, and extension. He's been taking Aleve, Ibuprofen with temporary improvement.   He denies injury/trauma to his neck or shoulder, weakness. He does have intermittent numbness to his left forearm.   Review of Systems  Constitutional: Negative for fever.  Musculoskeletal: Positive for arthralgias.       Chronic left upper back and shoulder pain  Skin: Negative for color change.  Neurological: Positive for numbness. Negative for weakness.       Past Medical History:  Diagnosis Date  . Benign prostatic hypertrophy   . GERD (gastroesophageal reflux disease)   . Gout   . Hyperlipidemia    no meds  . Hypertension      Social History   Socioeconomic History  . Marital status: Divorced    Spouse name: Not on file  . Number of children: Not on file  . Years of education: Not on file  . Highest education level: Not on file  Social Needs  . Financial resource strain: Not on file  . Food insecurity - worry: Not on file  . Food insecurity - inability: Not on file  . Transportation needs - medical: Not on file  . Transportation needs - non-medical: Not on file  Occupational History  . Occupation: Septic tank service   Tobacco Use  . Smoking status: Current Every Day Smoker    Packs/day: 1.00    Years: 44.00    Pack years: 44.00    Types: Cigarettes  . Smokeless tobacco: Never Used  Substance and Sexual Activity  . Alcohol use: No   Alcohol/week: 0.0 oz  . Drug use: No  . Sexual activity: Not on file  Other Topics Concern  . Not on file  Social History Narrative   Divorced, 2 children, works as a Administrator.    Past Surgical History:  Procedure Laterality Date  . APPENDECTOMY    . CARPAL TUNNEL RELEASE    . CHOLECYSTECTOMY    . COLONOSCOPY  04/2001 6/09   polyps  . liver hemangioma  9/09    Family History  Problem Relation Age of Onset  . Cancer Father   . Colon cancer Father   . Heart disease Brother   . Esophageal cancer Neg Hx   . Stomach cancer Neg Hx   . Rectal cancer Neg Hx     Allergies  Allergen Reactions  . Lipitor [Atorvastatin]     Leg pain    Current Outpatient Medications on File Prior to Visit  Medication Sig Dispense Refill  . IBUPROFEN PO Take by mouth as needed.    Marland Kitchen amLODipine (NORVASC) 5 MG tablet TAKE 1 TABLET (5 MG TOTAL) BY MOUTH DAILY. (Patient not taking: Reported on 11/07/2017) 90 tablet 3  . rosuvastatin (CRESTOR) 5 MG tablet Take 1 tablet (5 mg total) by mouth daily. (Patient not taking: Reported on 11/07/2017) 90 tablet 3   No current facility-administered medications on file  prior to visit.     BP (!) 146/82   Pulse 72   Temp 97.7 F (36.5 C) (Oral)   Ht 5\' 9"  (1.753 m)   Wt 195 lb 4 oz (88.6 kg)   SpO2 97%   BMI 28.83 kg/m    Objective:   Physical Exam  Constitutional: He appears well-nourished.  Neck: Neck supple.  Cardiovascular: Normal rate.  Pulmonary/Chest: Effort normal. He has no wheezes. He has no rales.  Musculoskeletal:       Left shoulder: He exhibits decreased range of motion and pain. He exhibits no tenderness, no bony tenderness and normal strength.  Moderate decrease in ROM with posterior abduction, mild-moderate decrease in ROM with lateral and forward abduction. Negative can test. 5/5 strength to bilateral upper extremities.   Skin: Skin is warm and dry.          Assessment & Plan:

## 2017-11-07 NOTE — Assessment & Plan Note (Signed)
Present for the last 6 months, no injury/trauma. Today's exam more suggestive of frozen shoulder from arthritis, low suspicion for rotator cuff tear.  Obtain plain films of neck and shoulder today. Rx for Robaxin and prednisone courses sent to pharmacy.  Handout from ROM exercises provided. Consider PT vs Sports Medicine referral, await plain film results.

## 2017-11-07 NOTE — Patient Instructions (Signed)
Complete xray(s) prior to leaving today. I will notify you of your results once received.  You may take the muscle relaxer, methocarbamol, every 8 hours as needed for muscle spasm. Start at bedtime as this medication can cause drowsiness.  Start prednisone tablets for inflammation. Take three tablets for 3 days, then two tablets for 3 days, then one tablet for 3 days.  Stop taking Aleve and Ibuprofen when taking prednisone, you may take Tylenol instead.   It was a pleasure meeting you!   Shoulder Exercises Ask your health care provider which exercises are safe for you. Do exercises exactly as told by your health care provider and adjust them as directed. It is normal to feel mild stretching, pulling, tightness, or discomfort as you do these exercises, but you should stop right away if you feel sudden pain or your pain gets worse.Do not begin these exercises until told by your health care provider. RANGE OF MOTION EXERCISES These exercises warm up your muscles and joints and improve the movement and flexibility of your shoulder. These exercises also help to relieve pain, numbness, and tingling. These exercises involve stretching your injured shoulder directly. Exercise A: Pendulum  1. Stand near a wall or a surface that you can hold onto for balance. 2. Bend at the waist and let your left / right arm hang straight down. Use your other arm to support you. Keep your back straight and do not lock your knees. 3. Relax your left / right arm and shoulder muscles, and move your hips and your trunk so your left / right arm swings freely. Your arm should swing because of the motion of your body, not because you are using your arm or shoulder muscles. 4. Keep moving your body so your arm swings in the following directions, as told by your health care provider: ? Side to side. ? Forward and backward. ? In clockwise and counterclockwise circles. 5. Continue each motion for __________ seconds, or for as  long as told by your health care provider. 6. Slowly return to the starting position. Repeat __________ times. Complete this exercise __________ times a day. Exercise B:Flexion, Standing  1. Stand and hold a broomstick, a cane, or a similar object. Place your hands a little more than shoulder-width apart on the object. Your left / right hand should be palm-up, and your other hand should be palm-down. 2. Keep your elbow straight and keep your shoulder muscles relaxed. Push the stick down with your healthy arm to raise your left / right arm in front of your body, and then over your head until you feel a stretch in your shoulder. ? Avoid shrugging your shoulder while you raise your arm. Keep your shoulder blade tucked down toward the middle of your back. 3. Hold for __________ seconds. 4. Slowly return to the starting position. Repeat __________ times. Complete this exercise __________ times a day. Exercise C: Abduction, Standing 1. Stand and hold a broomstick, a cane, or a similar object. Place your hands a little more than shoulder-width apart on the object. Your left / right hand should be palm-up, and your other hand should be palm-down. 2. While keeping your elbow straight and your shoulder muscles relaxed, push the stick across your body toward your left / right side. Raise your left / right arm to the side of your body and then over your head until you feel a stretch in your shoulder. ? Do not raise your arm above shoulder height, unless your health care  provider tells you to do that. ? Avoid shrugging your shoulder while you raise your arm. Keep your shoulder blade tucked down toward the middle of your back. 3. Hold for __________ seconds. 4. Slowly return to the starting position. Repeat __________ times. Complete this exercise __________ times a day. Exercise D:Internal Rotation  1. Place your left / right hand behind your back, palm-up. 2. Use your other hand to dangle an exercise  band, a towel, or a similar object over your shoulder. Grasp the band with your left / right hand so you are holding onto both ends. 3. Gently pull up on the band until you feel a stretch in the front of your left / right shoulder. ? Avoid shrugging your shoulder while you raise your arm. Keep your shoulder blade tucked down toward the middle of your back. 4. Hold for __________ seconds. 5. Release the stretch by letting go of the band and lowering your hands. Repeat __________ times. Complete this exercise __________ times a day. STRETCHING EXERCISES These exercises warm up your muscles and joints and improve the movement and flexibility of your shoulder. These exercises also help to relieve pain, numbness, and tingling. These exercises are done using your healthy shoulder to help stretch the muscles of your injured shoulder. Exercise E: Warehouse manager (External Rotation and Abduction)  1. Stand in a doorway with one of your feet slightly in front of the other. This is called a staggered stance. If you cannot reach your forearms to the door frame, stand facing a corner of a room. 2. Choose one of the following positions as told by your health care provider: ? Place your hands and forearms on the door frame above your head. ? Place your hands and forearms on the door frame at the height of your head. ? Place your hands on the door frame at the height of your elbows. 3. Slowly move your weight onto your front foot until you feel a stretch across your chest and in the front of your shoulders. Keep your head and chest upright and keep your abdominal muscles tight. 4. Hold for __________ seconds. 5. To release the stretch, shift your weight to your back foot. Repeat __________ times. Complete this stretch __________ times a day. Exercise F:Extension, Standing 1. Stand and hold a broomstick, a cane, or a similar object behind your back. ? Your hands should be a little wider than shoulder-width  apart. ? Your palms should face away from your back. 2. Keeping your elbows straight and keeping your shoulder muscles relaxed, move the stick away from your body until you feel a stretch in your shoulder. ? Avoid shrugging your shoulders while you move the stick. Keep your shoulder blade tucked down toward the middle of your back. 3. Hold for __________ seconds. 4. Slowly return to the starting position. Repeat __________ times. Complete this exercise __________ times a day. STRENGTHENING EXERCISES These exercises build strength and endurance in your shoulder. Endurance is the ability to use your muscles for a long time, even after they get tired. Exercise G:External Rotation  1. Sit in a stable chair without armrests. 2. Secure an exercise band at elbow height on your left / right side. 3. Place a soft object, such as a folded towel or a small pillow, between your left / right upper arm and your body to move your elbow a few inches away (about 10 cm) from your side. 4. Hold the end of the band so it is tight and  there is no slack. 5. Keeping your elbow pressed against the soft object, move your left / right forearm out, away from your abdomen. Keep your body steady so only your forearm moves. 6. Hold for __________ seconds. 7. Slowly return to the starting position. Repeat __________ times. Complete this exercise __________ times a day. Exercise H:Shoulder Abduction  1. Sit in a stable chair without armrests, or stand. 2. Hold a __________ weight in your left / right hand, or hold an exercise band with both hands. 3. Start with your arms straight down and your left / right palm facing in, toward your body. 4. Slowly lift your left / right hand out to your side. Do not lift your hand above shoulder height unless your health care provider tells you that this is safe. ? Keep your arms straight. ? Avoid shrugging your shoulder while you do this movement. Keep your shoulder blade tucked down  toward the middle of your back. 5. Hold for __________ seconds. 6. Slowly lower your arm, and return to the starting position. Repeat __________ times. Complete this exercise __________ times a day. Exercise I:Shoulder Extension 1. Sit in a stable chair without armrests, or stand. 2. Secure an exercise band to a stable object in front of you where it is at shoulder height. 3. Hold one end of the exercise band in each hand. Your palms should face each other. 4. Straighten your elbows and lift your hands up to shoulder height. 5. Step back, away from the secured end of the exercise band, until the band is tight and there is no slack. 6. Squeeze your shoulder blades together as you pull your hands down to the sides of your thighs. Stop when your hands are straight down by your sides. Do not let your hands go behind your body. 7. Hold for __________ seconds. 8. Slowly return to the starting position. Repeat __________ times. Complete this exercise __________ times a day. Exercise J:Standing Shoulder Row 1. Sit in a stable chair without armrests, or stand. 2. Secure an exercise band to a stable object in front of you so it is at waist height. 3. Hold one end of the exercise band in each hand. Your palms should be in a thumbs-up position. 4. Bend each of your elbows to an "L" shape (about 90 degrees) and keep your upper arms at your sides. 5. Step back until the band is tight and there is no slack. 6. Slowly pull your elbows back behind you. 7. Hold for __________ seconds. 8. Slowly return to the starting position. Repeat __________ times. Complete this exercise __________ times a day. Exercise K:Shoulder Press-Ups  1. Sit in a stable chair that has armrests. Sit upright, with your feet flat on the floor. 2. Put your hands on the armrests so your elbows are bent and your fingers are pointing forward. Your hands should be about even with the sides of your body. 3. Push down on the armrests  and use your arms to lift yourself off of the chair. Straighten your elbows and lift yourself up as much as you comfortably can. ? Move your shoulder blades down, and avoid letting your shoulders move up toward your ears. ? Keep your feet on the ground. As you get stronger, your feet should support less of your body weight as you lift yourself up. 4. Hold for __________ seconds. 5. Slowly lower yourself back into the chair. Repeat __________ times. Complete this exercise __________ times a day. Exercise L: Wall Push-Ups  1. Stand so you are facing a stable wall. Your feet should be about one arm-length away from the wall. 2. Lean forward and place your palms on the wall at shoulder height. 3. Keep your feet flat on the floor as you bend your elbows and lean forward toward the wall. 4. Hold for __________ seconds. 5. Straighten your elbows to push yourself back to the starting position. Repeat __________ times. Complete this exercise __________ times a day. This information is not intended to replace advice given to you by your health care provider. Make sure you discuss any questions you have with your health care provider. Document Released: 07/24/2005 Document Revised: 06/03/2016 Document Reviewed: 05/21/2015 Elsevier Interactive Patient Education  2018 [employer].

## 2017-11-10 ENCOUNTER — Telehealth: Payer: Self-pay | Admitting: Family Medicine

## 2017-11-10 NOTE — Telephone Encounter (Signed)
Noted, needs to complete steroids as prescribed. If he's not feeling better towards the end of his steroids then we can  proceed with physical therapy or have him see Dr. Lorelei Pont.

## 2017-11-10 NOTE — Telephone Encounter (Signed)
Pt wanted to inform provider that he will be out of steroids in 5 days.

## 2017-11-12 NOTE — Telephone Encounter (Signed)
Message left for patient to return my call on 11/10/2017 and 11/12/2017

## 2017-12-03 DIAGNOSIS — N2889 Other specified disorders of kidney and ureter: Secondary | ICD-10-CM | POA: Diagnosis not present

## 2017-12-03 DIAGNOSIS — N200 Calculus of kidney: Secondary | ICD-10-CM | POA: Diagnosis not present

## 2017-12-03 DIAGNOSIS — D4102 Neoplasm of uncertain behavior of left kidney: Secondary | ICD-10-CM | POA: Diagnosis not present

## 2017-12-03 DIAGNOSIS — N132 Hydronephrosis with renal and ureteral calculous obstruction: Secondary | ICD-10-CM | POA: Diagnosis not present

## 2017-12-03 DIAGNOSIS — R1084 Generalized abdominal pain: Secondary | ICD-10-CM | POA: Diagnosis not present

## 2017-12-10 DIAGNOSIS — N289 Disorder of kidney and ureter, unspecified: Secondary | ICD-10-CM | POA: Diagnosis not present

## 2017-12-10 DIAGNOSIS — D4102 Neoplasm of uncertain behavior of left kidney: Secondary | ICD-10-CM | POA: Diagnosis not present

## 2017-12-22 DIAGNOSIS — N486 Induration penis plastica: Secondary | ICD-10-CM | POA: Diagnosis not present

## 2017-12-22 DIAGNOSIS — N200 Calculus of kidney: Secondary | ICD-10-CM | POA: Diagnosis not present

## 2017-12-22 DIAGNOSIS — D3001 Benign neoplasm of right kidney: Secondary | ICD-10-CM | POA: Diagnosis not present

## 2018-03-22 IMAGING — DX DG SHOULDER 2+V*L*
3 series · 3 of 3 positions shown · non-contrast
Comparison: None.

CLINICAL DATA: Chronic shoulder pain.

EXAM:
LEFT SHOULDER - 2+ VIEW

[shoulder axial]
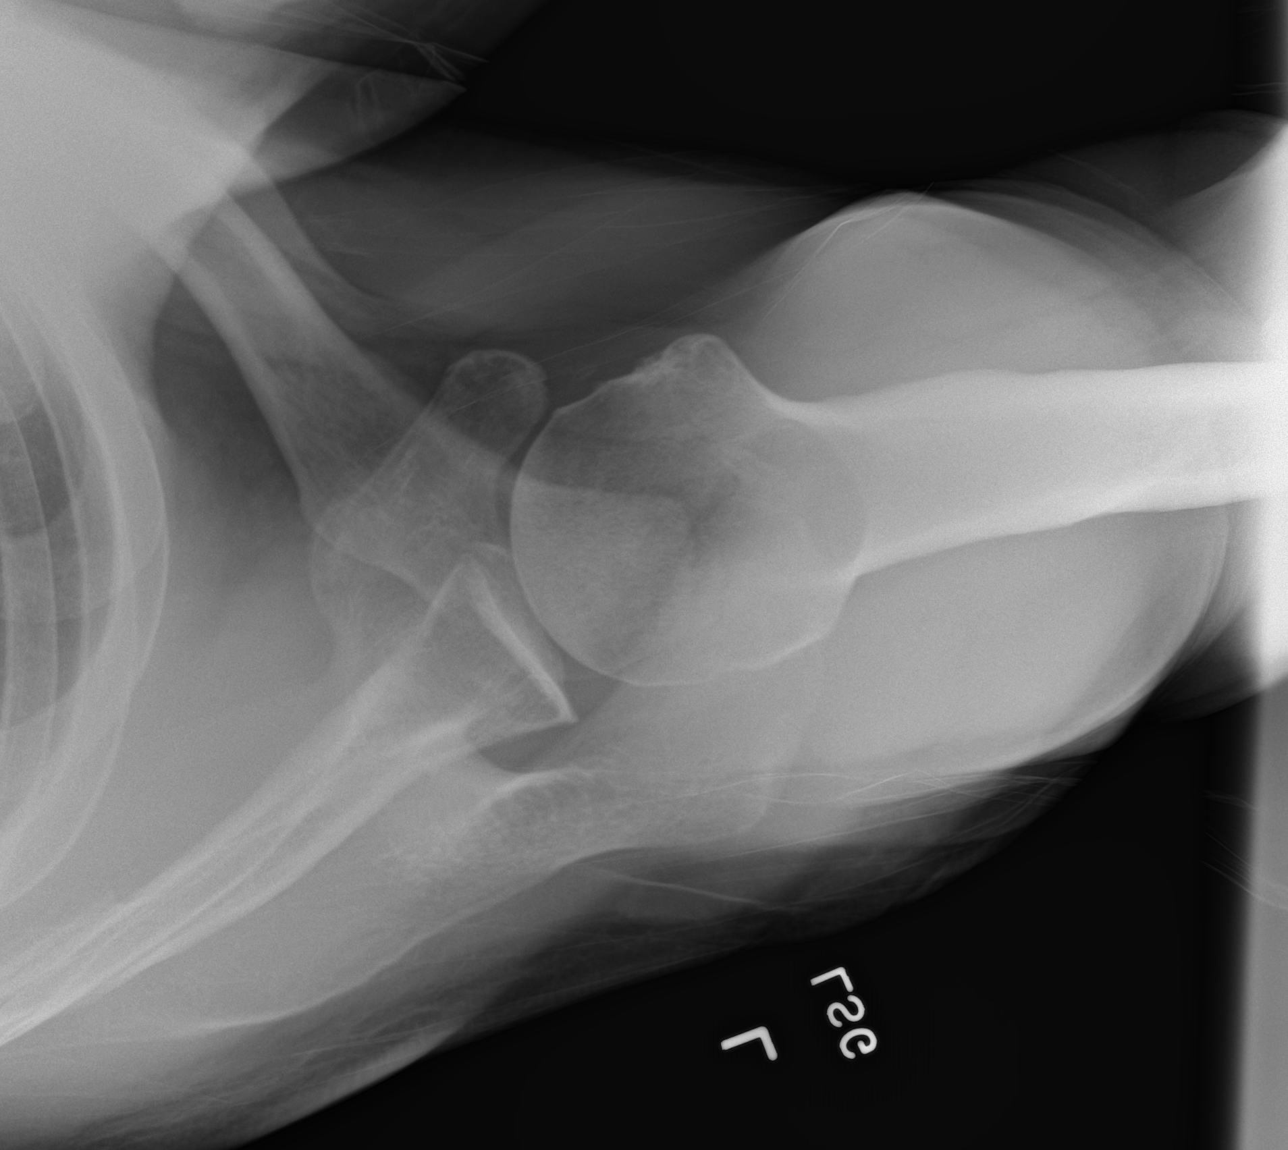

[shoulder ap]
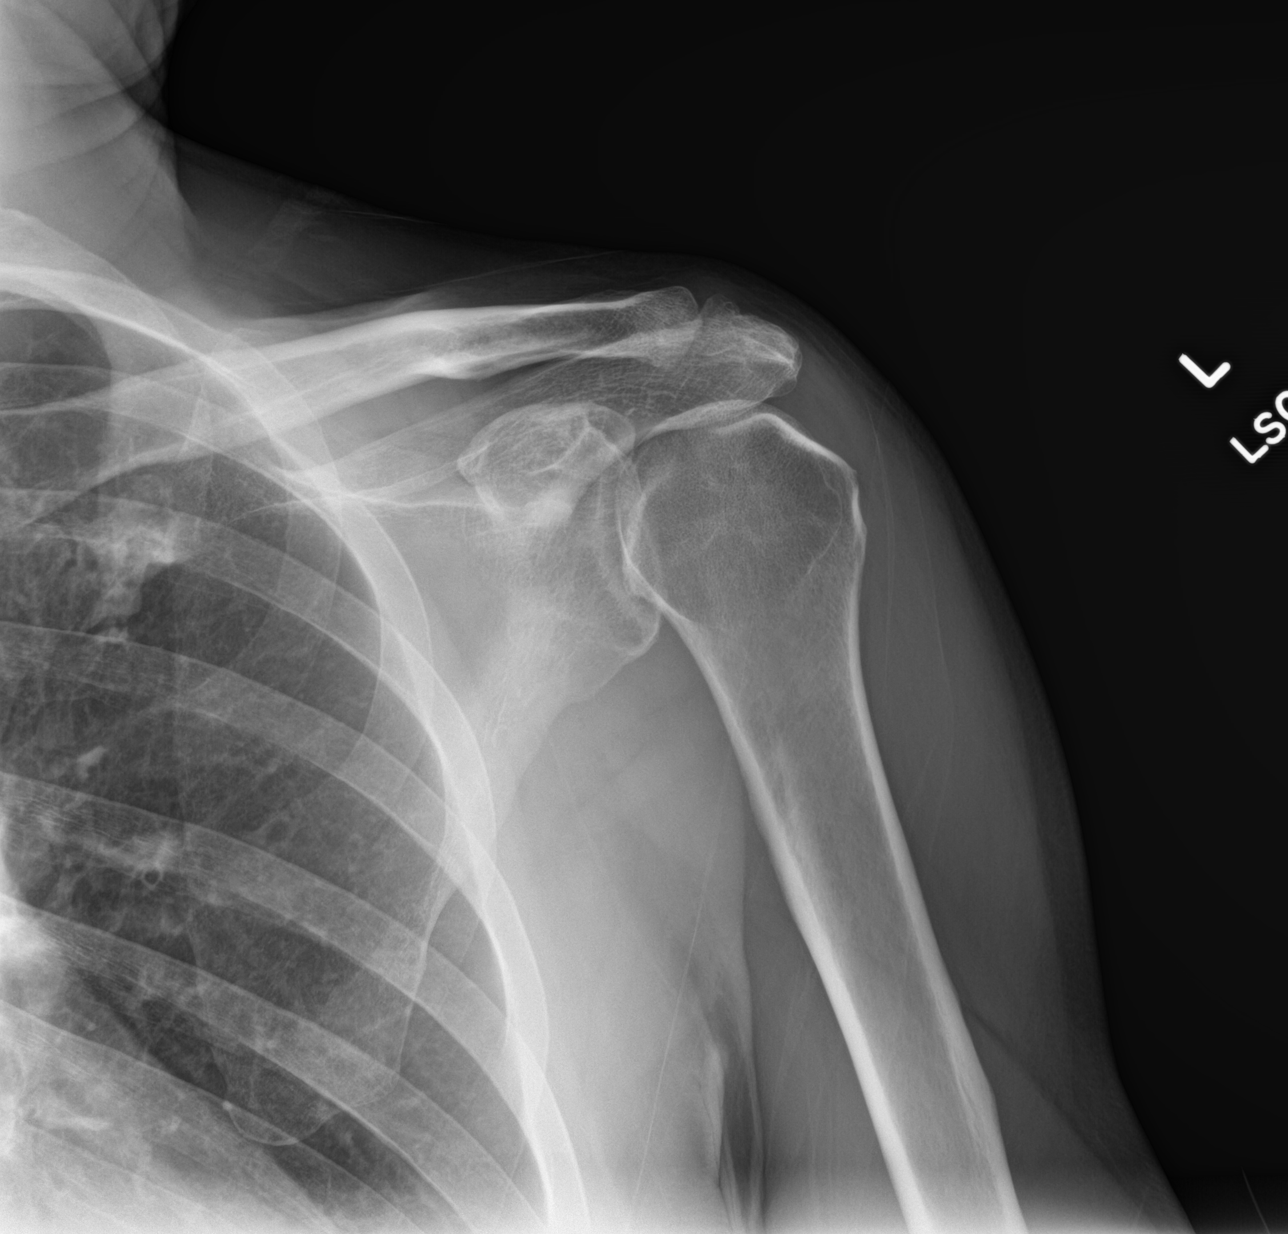

[shoulder y-view]
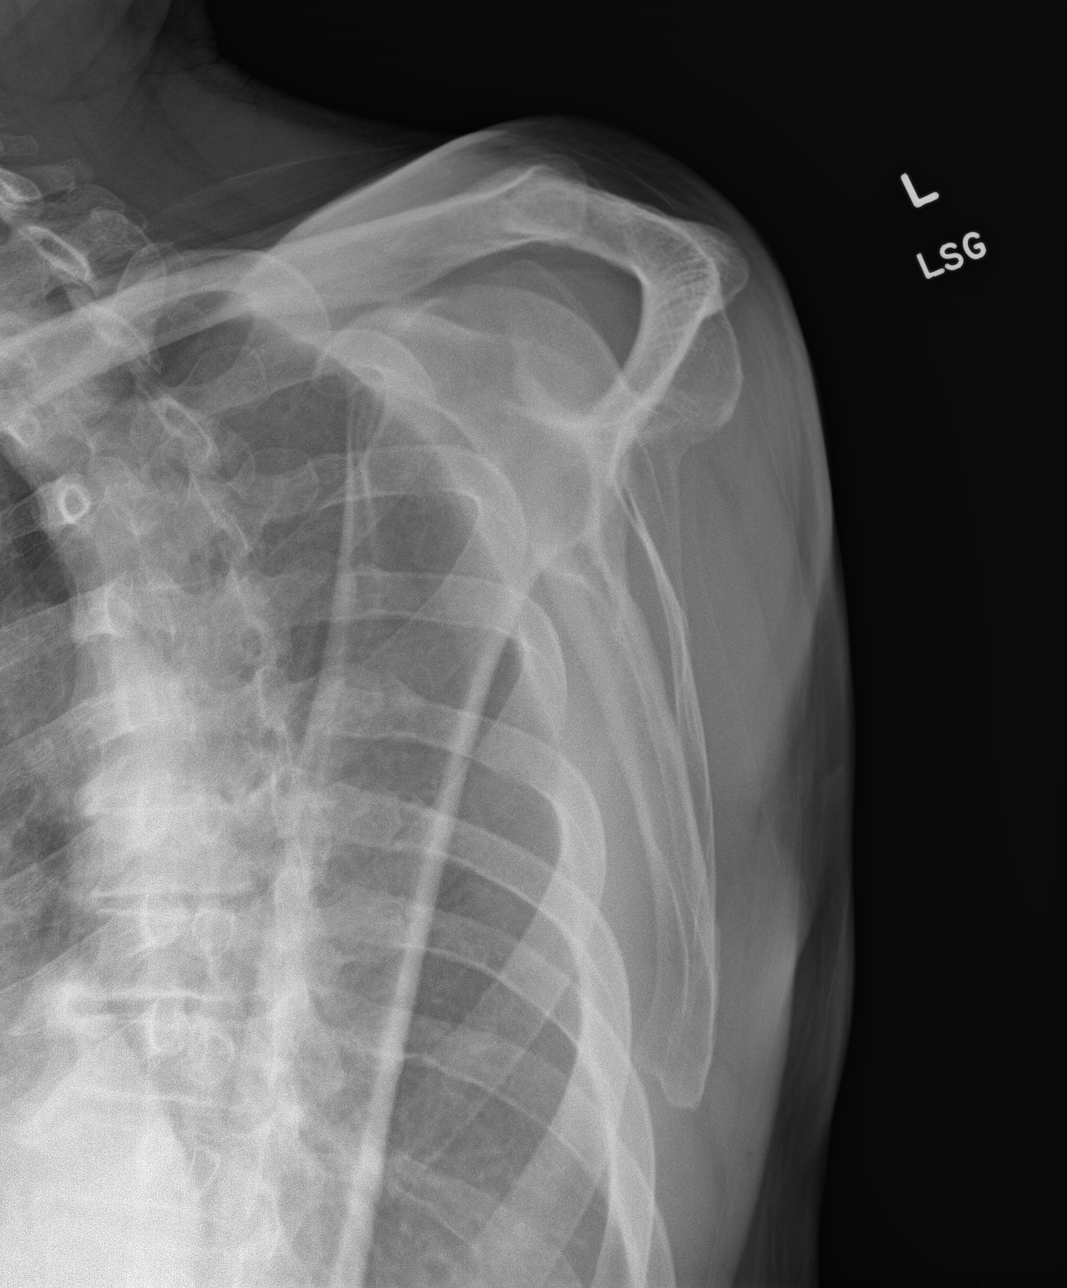

[3 of 3 positions shown; findings below may reference images not displayed]

FINDINGS: There is no evidence of fracture or dislocation. There is no
evidence of arthropathy or other focal bone abnormality. Soft
tissues are unremarkable.
IMPRESSION: Negative.

## 2018-03-22 IMAGING — DX DG CERVICAL SPINE 2 OR 3 VIEWS
4 series · 4 of 4 positions shown · non-contrast
Comparison: No recent prior.

CLINICAL DATA: Pain left shoulder.

EXAM:
CERVICAL SPINE - 2-3 VIEW

[c-spine lat]
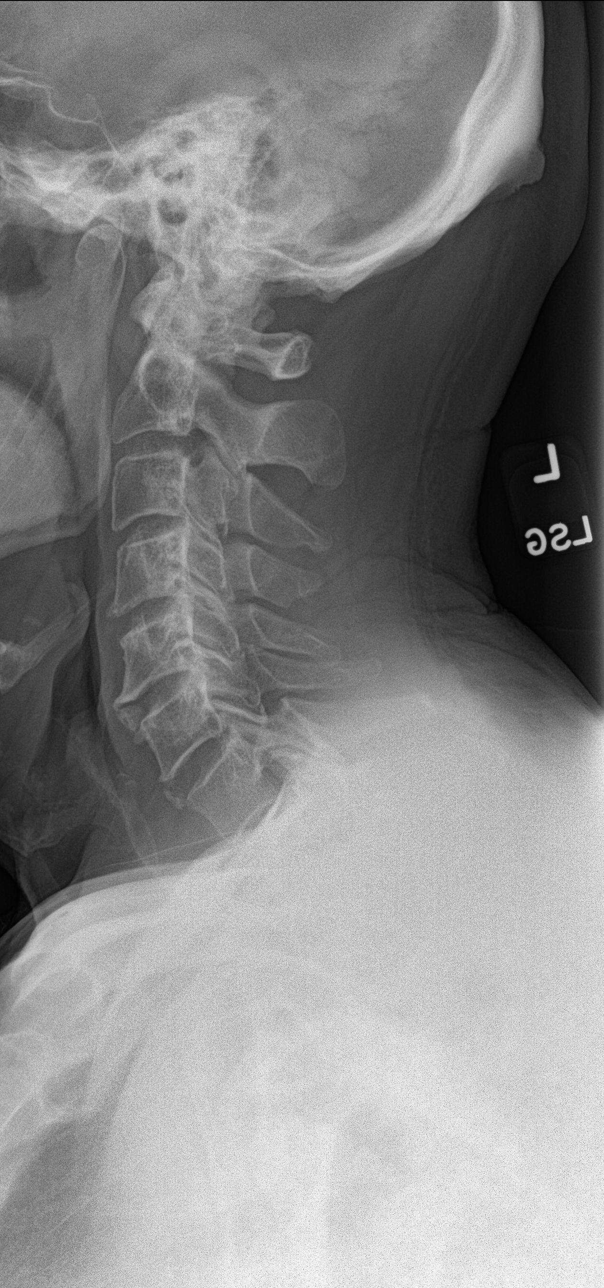

[c-spine ap]
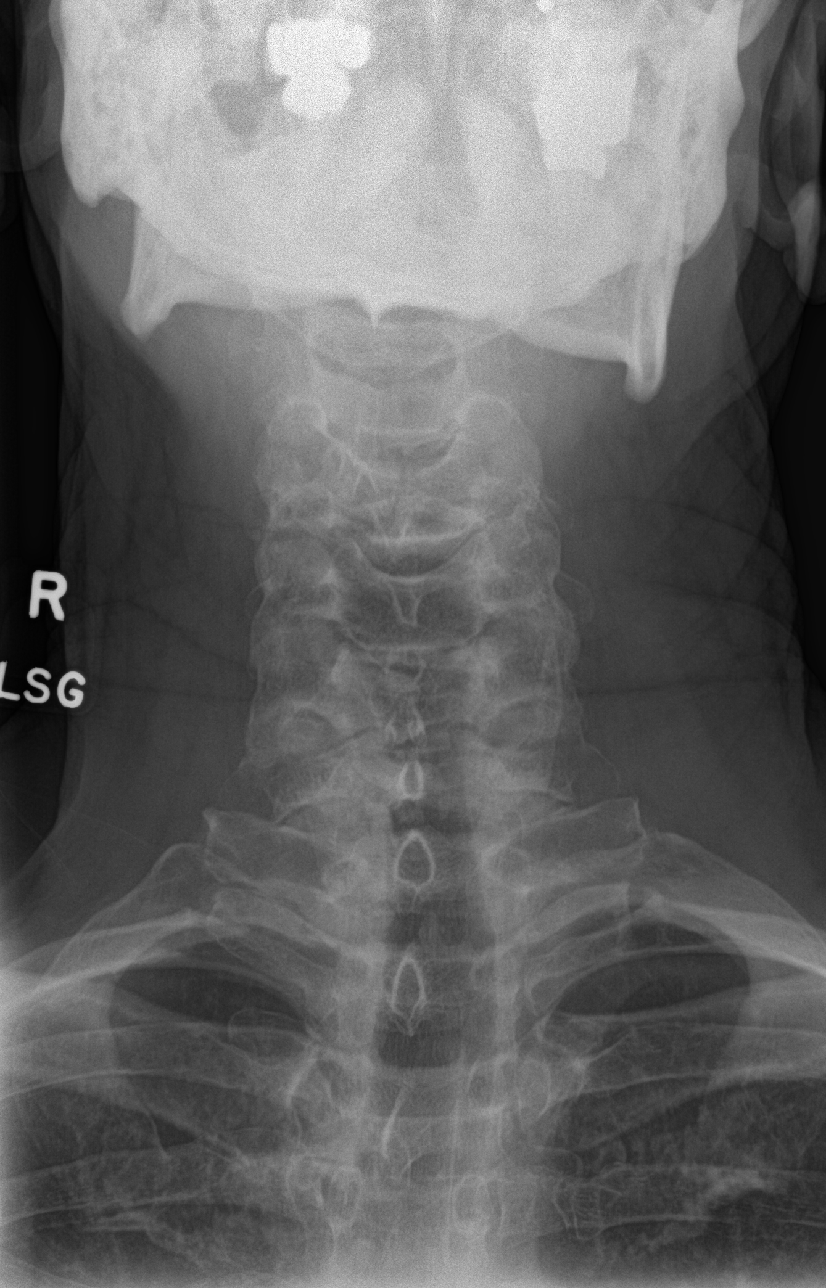

[c-spine open mouth]
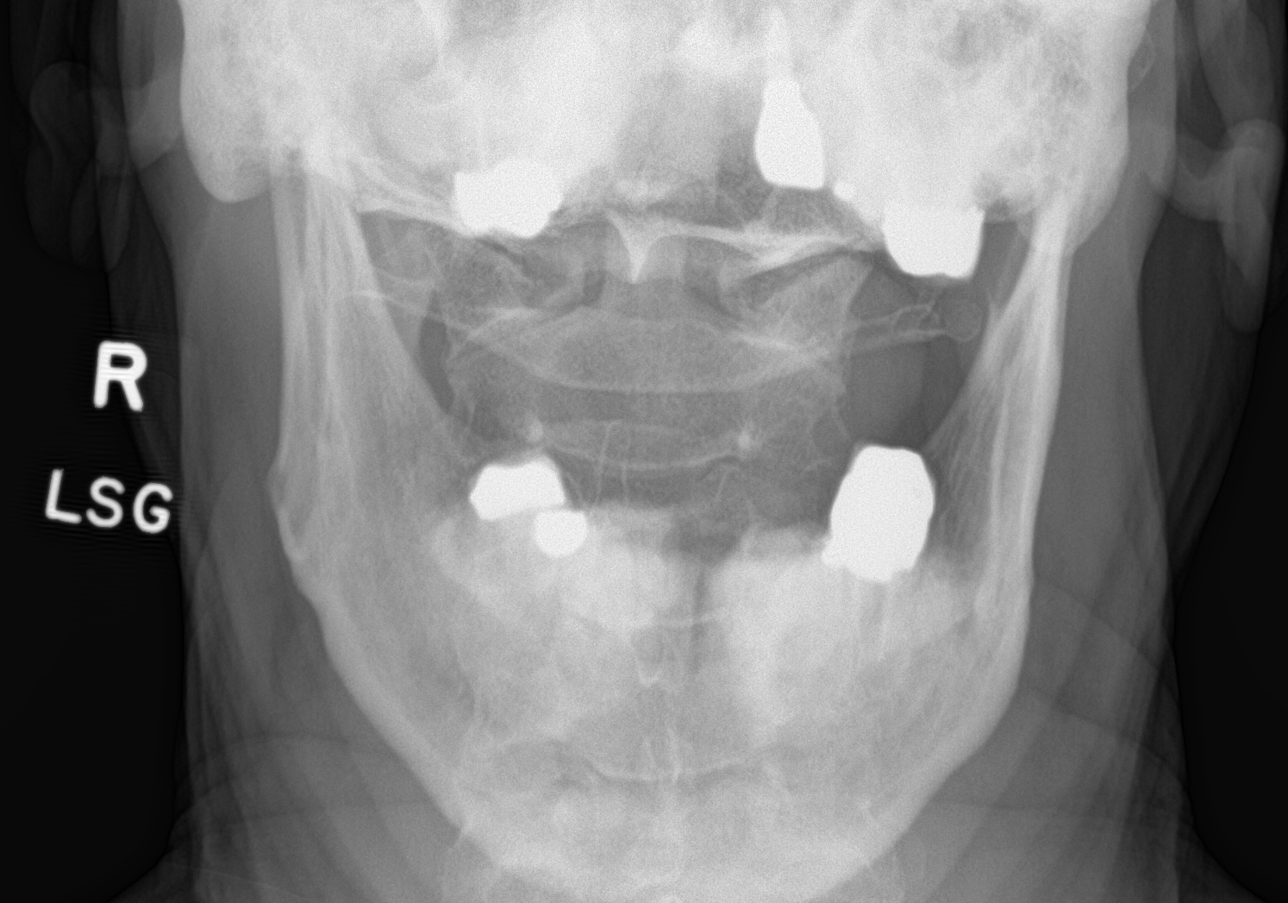

[c-spine swimmers]
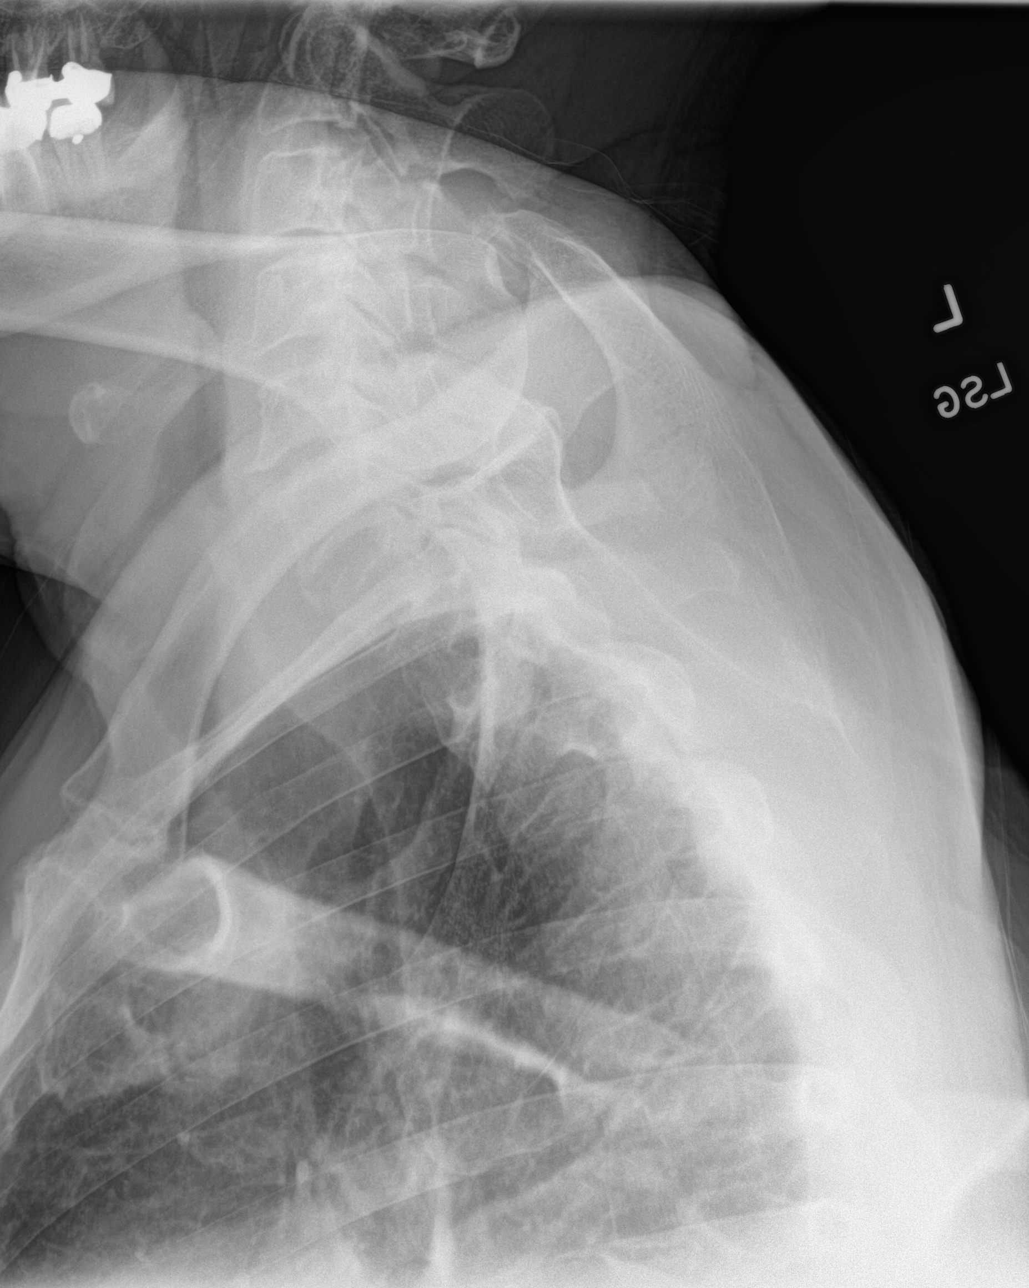

[4 of 4 positions shown; findings below may reference images not displayed]

FINDINGS: Degenerative changes cervical spine. Degenerative changes most
prominent at C4-C5 and C5-C6. No evidence of fracture or
dislocation. No acute abnormality identified. Pulmonary apices are
clear.
IMPRESSION: Degenerative changes cervical spine. Degenerative changes most
prominent C4-C5 and C5-C6. No acute abnormality identified.

## 2018-03-27 ENCOUNTER — Other Ambulatory Visit: Payer: Self-pay

## 2018-03-27 ENCOUNTER — Encounter (HOSPITAL_COMMUNITY): Payer: Self-pay | Admitting: Emergency Medicine

## 2018-03-27 ENCOUNTER — Emergency Department (HOSPITAL_COMMUNITY)
Admission: EM | Admit: 2018-03-27 | Discharge: 2018-03-27 | Disposition: A | Discharge status: Home / Self Care | Payer: BLUE CROSS/BLUE SHIELD | Attending: Emergency Medicine | Admitting: Emergency Medicine

## 2018-03-27 ENCOUNTER — Emergency Department (HOSPITAL_COMMUNITY): Payer: BLUE CROSS/BLUE SHIELD

## 2018-03-27 DIAGNOSIS — N200 Calculus of kidney: Secondary | ICD-10-CM | POA: Diagnosis not present

## 2018-03-27 DIAGNOSIS — Z79899 Other long term (current) drug therapy: Secondary | ICD-10-CM | POA: Insufficient documentation

## 2018-03-27 DIAGNOSIS — F17210 Nicotine dependence, cigarettes, uncomplicated: Secondary | ICD-10-CM | POA: Diagnosis not present

## 2018-03-27 DIAGNOSIS — I10 Essential (primary) hypertension: Secondary | ICD-10-CM | POA: Insufficient documentation

## 2018-03-27 DIAGNOSIS — R1084 Generalized abdominal pain: Secondary | ICD-10-CM | POA: Diagnosis present

## 2018-03-27 DIAGNOSIS — R109 Unspecified abdominal pain: Secondary | ICD-10-CM | POA: Diagnosis not present

## 2018-03-27 DIAGNOSIS — R319 Hematuria, unspecified: Secondary | ICD-10-CM | POA: Diagnosis not present

## 2018-03-27 DIAGNOSIS — R112 Nausea with vomiting, unspecified: Secondary | ICD-10-CM | POA: Diagnosis not present

## 2018-03-27 LAB — CBC
HEMATOCRIT: 44.6 % (ref 39.0–52.0)
Hemoglobin: 15.2 g/dL (ref 13.0–17.0)
MCH: 31.9 pg (ref 26.0–34.0)
MCHC: 34.1 g/dL (ref 30.0–36.0)
MCV: 93.7 fL (ref 78.0–100.0)
Platelets: 245 10*3/uL (ref 150–400)
RBC: 4.76 MIL/uL (ref 4.22–5.81)
RDW: 13 % (ref 11.5–15.5)
WBC: 17.5 10*3/uL — ABNORMAL HIGH (ref 4.0–10.5)

## 2018-03-27 LAB — URINALYSIS, ROUTINE W REFLEX MICROSCOPIC
Bilirubin Urine: NEGATIVE
GLUCOSE, UA: NEGATIVE mg/dL
HGB URINE DIPSTICK: NEGATIVE
Ketones, ur: NEGATIVE mg/dL
Leukocytes, UA: NEGATIVE
Nitrite: NEGATIVE
PH: 5 (ref 5.0–8.0)
PROTEIN: NEGATIVE mg/dL
Specific Gravity, Urine: 1.019 (ref 1.005–1.030)

## 2018-03-27 LAB — BASIC METABOLIC PANEL
Anion gap: 9 (ref 5–15)
BUN: 15 mg/dL (ref 8–23)
CHLORIDE: 103 mmol/L (ref 98–111)
CO2: 25 mmol/L (ref 22–32)
Calcium: 8.7 mg/dL — ABNORMAL LOW (ref 8.9–10.3)
Creatinine, Ser: 1.5 mg/dL — ABNORMAL HIGH (ref 0.61–1.24)
GFR calc Af Amer: 56 mL/min — ABNORMAL LOW (ref >60)
GFR, EST NON AFRICAN AMERICAN: 48 mL/min — AB (ref >60)
GLUCOSE: 123 mg/dL — AB (ref 70–99)
POTASSIUM: 4.3 mmol/L (ref 3.5–5.1)
Sodium: 137 mmol/L (ref 135–145)

## 2018-03-27 MED ORDER — ONDANSETRON 4 MG PO TBDP: 4.0000 mg | ORAL_TABLET | Freq: Three times a day (TID) | ORAL | 0 refills | Status: DC | PRN

## 2018-03-27 MED ORDER — ONDANSETRON HCL 4 MG/2ML IJ SOLN
4.0000 mg | Freq: Once | INTRAMUSCULAR | Status: AC
  Administered 2018-03-27: 4 mg via INTRAVENOUS
  Filled 2018-03-27: qty 2

## 2018-03-27 MED ORDER — SODIUM CHLORIDE 0.9 % IV BOLUS
1000.0000 mL | Freq: Once | INTRAVENOUS | Status: AC
  Administered 2018-03-27: 1000 mL via INTRAVENOUS

## 2018-03-27 MED ORDER — TAMSULOSIN HCL 0.4 MG PO CAPS: 0.4000 mg | ORAL_CAPSULE | Freq: Every day | ORAL | 0 refills | Status: DC

## 2018-03-27 MED ORDER — HYDROMORPHONE HCL 1 MG/ML IJ SOLN
1.0000 mg | Freq: Once | INTRAMUSCULAR | Status: AC
  Administered 2018-03-27: 1 mg via INTRAVENOUS
  Filled 2018-03-27: qty 1

## 2018-03-27 MED ORDER — KETOROLAC TROMETHAMINE 15 MG/ML IJ SOLN
15.0000 mg | Freq: Once | INTRAMUSCULAR | Status: AC
  Administered 2018-03-27: 15 mg via INTRAVENOUS
  Filled 2018-03-27: qty 1

## 2018-03-27 MED ORDER — SODIUM CHLORIDE 0.9 % IV BOLUS
500.0000 mL | Freq: Once | INTRAVENOUS | Status: AC
  Administered 2018-03-27: 500 mL via INTRAVENOUS

## 2018-03-27 MED ORDER — OXYCODONE-ACETAMINOPHEN 5-325 MG PO TABS: 1.0000 | ORAL_TABLET | Freq: Three times a day (TID) | ORAL | 0 refills | Status: DC | PRN

## 2018-03-27 NOTE — ED Notes (Signed)
Pt's urine collected at this time. Culture sample sent to lab with urine sample. Tech screened pt's urine as well to check for passing of kidney stone, negative for stone to visual eye.

## 2018-03-27 NOTE — ED Provider Notes (Signed)
[facility] DEPT Provider Note   CSN: [id number] Arrival date & time: 03/27/18  6295     History   Chief Complaint Chief Complaint  Patient presents with  . Nephrolithiasis    HPI [name] [name] is a 62 y.o. male with a past medical history of BPH, hypertension, who presents to ED for evaluation of 2-day history of constant left flank pain.  States that he has been trying to manage the pain with his home hydrocodone.  States that symptoms got worse this morning.  Reports associated nausea and several episodes of nonbloody, nonbilious emesis.  Reports no bowel movement for the past 2 days.  States it feels similar to his prior kidney stones.  Last episode of flank pain due to kidney stone that he has had was 3 months ago.  Unsure what side the pain was on.  But states that the stones passed without intervention. Partner is concerned that he is dehydrated because he has been having dark urine he is established with a urologist in the area.  He denies any fevers, chest pain, shortness of breath, injuries or falls, hematemesis, dysuria, testicular or penile pain/symptoms.  HPI  Past Medical History:  Diagnosis Date  . Benign prostatic hypertrophy   . GERD (gastroesophageal reflux disease)   . Gout   . Hyperlipidemia    no meds  . Hypertension     Patient Active Problem List   Diagnosis Date Noted  . Chronic left shoulder pain 11/07/2017  . Muscle cramp 03/01/2016  . Cough 01/30/2015  . Essential hypertension 01/30/2015  . Family history of colon cancer 01/30/2015  . Routine general medical examination at a health care facility 05/18/2012  . Prostate cancer screening 05/18/2012  . LATERAL EPICONDYLITIS 04/26/2010  . BACK PAIN, LUMBAR 07/07/2008  . NONSPECIFIC MESENTERIC LYMPHADENITIS 06/08/2008  . COLONIC POLYPS 01/27/2008  . Cigarette smoker 01/27/2008  . FOOT PAIN, LEFT 01/27/2008  . SYNCOPE 01/27/2008  . FATIGUE 01/27/2008  .  Hyperlipidemia 01/26/2008  . GOUT 01/26/2008  . GERD 01/26/2008  . BPH (benign prostatic hyperplasia) 01/26/2008    Past Surgical History:  Procedure Laterality Date  . APPENDECTOMY    . CARPAL TUNNEL RELEASE    . CHOLECYSTECTOMY    . COLONOSCOPY  04/2001 6/09   polyps  . liver hemangioma  9/09        Home Medications    Prior to Admission medications   Medication Sig Start Date End Date Taking? Authorizing Provider  amLODipine (NORVASC) 5 MG tablet TAKE 1 TABLET (5 MG TOTAL) BY MOUTH DAILY. 04/09/17  Yes Tower, Wynelle Fanny, MD  ibuprofen (ADVIL,MOTRIN) 200 MG tablet Take 600 mg by mouth at bedtime as needed for mild pain.   Yes [provider]  methocarbamol (ROBAXIN) 500 MG tablet Take 1 tablet (500 mg total) by mouth every 8 (eight) hours as needed for muscle spasms. May cause drowsiness. 11/07/17  Yes Pleas Koch, NP  rosuvastatin (CRESTOR) 5 MG tablet Take 1 tablet (5 mg total) by mouth daily. 04/09/17  Yes Tower, Wynelle Fanny, MD  ondansetron (ZOFRAN ODT) 4 MG disintegrating tablet Take 1 tablet (4 mg total) by mouth every 8 (eight) hours as needed for nausea or vomiting. 03/27/18   Marki Frede, PA-C  oxyCODONE-acetaminophen (PERCOCET/ROXICET) 5-325 MG tablet Take 1 tablet by mouth every 8 (eight) hours as needed for severe pain. 03/27/18   Yolanda Dockendorf, PA-C  tamsulosin (FLOMAX) 0.4 MG CAPS capsule Take 1 capsule (0.4 mg total)  by mouth daily after supper. 03/27/18   Delia Heady, PA-C    Family History Family History  Problem Relation Age of Onset  . Cancer Father   . Colon cancer Father   . Heart disease Brother   . Esophageal cancer Neg Hx   . Stomach cancer Neg Hx   . Rectal cancer Neg Hx     Social History Social History   Tobacco Use  . Smoking status: Current Every Day Smoker    Packs/day: 1.00    Years: 44.00    Pack years: 44.00    Types: Cigarettes  . Smokeless tobacco: Never Used  Substance Use Topics  . Alcohol use: No    Alcohol/week: 0.0 oz    . Drug use: No     Allergies   Lipitor [atorvastatin]   Review of Systems Review of Systems  Constitutional: Negative for appetite change, chills and fever.  HENT: Negative for ear pain, rhinorrhea, sneezing and sore throat.   Eyes: Negative for photophobia and visual disturbance.  Respiratory: Negative for cough, chest tightness, shortness of breath and wheezing.   Cardiovascular: Negative for chest pain and palpitations.  Gastrointestinal: Positive for nausea and vomiting. Negative for abdominal pain, blood in stool, constipation and diarrhea.  Genitourinary: Positive for flank pain and hematuria. Negative for dysuria, frequency, scrotal swelling, testicular pain and urgency.  Musculoskeletal: Negative for myalgias.  Skin: Negative for rash.  Neurological: Negative for dizziness, weakness and light-headedness.     Physical Exam Updated Vital Signs BP 140/72 (BP Location: Left Arm)   Pulse 78   Temp (!) 97.4 F (36.3 C) (Oral)   Resp 18   Ht 6' (1.829 m)   Wt 87.5 kg (193 lb)   SpO2 97%   BMI 26.18 kg/m   Physical Exam  Constitutional: He appears well-developed and well-nourished. No distress.  Appears uncomfortable.  HENT:  Head: Normocephalic and atraumatic.  Nose: Nose normal.  Eyes: Conjunctivae and EOM are normal. Right eye exhibits no discharge. Left eye exhibits no discharge. No scleral icterus.  Neck: Normal range of motion. Neck supple.  Cardiovascular: Normal rate, regular rhythm, normal heart sounds and intact distal pulses. Exam reveals no gallop and no friction rub.  No murmur heard. Pulmonary/Chest: Effort normal and breath sounds normal. No respiratory distress.  Abdominal: Soft. Bowel sounds are normal. He exhibits no distension. There is tenderness. There is no guarding.  Left CVA and left flank.  Musculoskeletal: Normal range of motion. He exhibits no edema.  Neurological: He is alert. He exhibits normal muscle tone. Coordination normal.  Skin:  Skin is warm and dry. No rash noted.  Psychiatric: He has a normal mood and affect.  Nursing note and vitals reviewed.    ED Treatments / Results  Labs (all labs ordered are listed, but only abnormal results are displayed) Labs Reviewed  BASIC METABOLIC PANEL - Abnormal; Notable for the following components:      Result Value   Glucose, Bld 123 (*)    Creatinine, Ser 1.50 (*)    Calcium 8.7 (*)    GFR calc non Af Amer 48 (*)    GFR calc Af Amer 56 (*)    All other components within normal limits  CBC - Abnormal; Notable for the following components:   WBC 17.5 (*)    All other components within normal limits  URINALYSIS, ROUTINE W REFLEX MICROSCOPIC    EKG None  Radiology Ct Renal Stone Study  Result Date: 03/27/2018 CLINICAL DATA:  Left flank pain onset 2 days ago; hx of kidney stones. EXAM: CT ABDOMEN AND PELVIS WITHOUT CONTRAST TECHNIQUE: Multidetector CT imaging of the abdomen and pelvis was performed following the standard protocol without IV contrast. COMPARISON:  12/10/2017 FINDINGS: Lower chest: Coronary calcifications. No pleural or pericardial effusion. Hepatobiliary: No focal liver abnormality is seen. Status post cholecystectomy. No biliary dilatation. Pancreas: Unremarkable. No pancreatic ductal dilatation or surrounding inflammatory changes. Spleen: Normal in size without focal abnormality. Accessory splenule. Adrenals/Urinary Tract: Normal adrenal glands. Right kidney unremarkable. 5.1 cm exophytic complex cyst lesion from the midpole left kidney, as previously evaluated. New left mild hydronephrosis and ureterectasis down to the level of a 5 mm calculus just proximal to the ureteral orifice. Moderate inflammatory/edematous changes around the left kidney and proximal ureter. No other nephrolithiasis. Urinary bladder incompletely distended. Stomach/Bowel: Stomach and small bowel are nondilated. Appendix surgically absent. Scattered diverticula from descending and sigmoid  portions of the colon without adjacent inflammatory/edematous change or abscess. Vascular/Lymphatic: Scattered aortoiliac arterial calcifications without aneurysm. No abdominal or pelvic adenopathy localized. Reproductive: Mild prostatic enlargement. Other: No ascites.  No free air. Musculoskeletal: No acute or significant osseous findings. IMPRESSION: 1.  5 mm distal left ureteral calculus with mild hydronephrosis. 2. Coronary and aortoiliac atherosclerosis (ICD10-170.0). 3. Descending and sigmoid diverticulosis. Electronically Signed   By: Lucrezia Europe M.D.   On: 03/27/2018 09:31    Procedures Procedures (including critical care time)  Medications Ordered in ED Medications  HYDROmorphone (DILAUDID) injection 1 mg (1 mg Intravenous Given 03/27/18 0855)  sodium chloride 0.9 % bolus 1,000 mL (0 mLs Intravenous Stopped 03/27/18 1035)  ondansetron (ZOFRAN) injection 4 mg (4 mg Intravenous Given 03/27/18 0855)  ketorolac (TORADOL) 15 MG/ML injection 15 mg (15 mg Intravenous Given 03/27/18 0947)  sodium chloride 0.9 % bolus 500 mL (500 mLs Intravenous New Bag/Given 03/27/18 1053)     Initial Impression / Assessment and Plan / ED Course  I have reviewed the triage vital signs and the nursing notes.  Pertinent labs & imaging results that were available during my care of the patient were reviewed by me and considered in my medical decision making (see chart for details).  Clinical Course as of Mar 27 1201  Fri Mar 27, 2018  0847 Ivin Booty, partner: [phone]   [HK]    Clinical Course User Index [HK] Delia Heady, [place]    Patient presents to ED for evaluation of 2-day history of left-sided flank pain.  Has been controlling it at home with leftover hydrocodone from prior urology appointment.  Reports nausea but denies any fever, changes in bowel movements, injuries or falls.  States that this feels similar to his prior kidney stones.  On physical exam he appeared uncomfortable.  Left CVA and flank tenderness  to palpation.  No abdominal tenderness to palpation and no rebound or guarding noted.  He is afebrile.  Lab work significant for leukocytosis at 17.5 which is likely reactive.  Mild elevation in creatinine to 1.5.  CT shows distal 5 mm left nephrolithiasis.  Patient's pain controlled here.  Given fluids for hydration.  Urinalysis unremarkable.  Will discharge home with pain medication, antiemetics, Flomax and urology follow-up.  Advised to return to ED for any severe worsening symptoms. [place] PMP reviewed with no discrepancies.  Portions of this note were generated with [employer]. Dictation errors may occur despite best attempts at proofreading.   Final Clinical Impressions(s) / ED Diagnoses   Final diagnoses:  Nephrolithiasis  ED Discharge Orders        Ordered    oxyCODONE-acetaminophen (PERCOCET/ROXICET) 5-325 MG tablet  Every 8 hours PRN     03/27/18 1200    ondansetron (ZOFRAN ODT) 4 MG disintegrating tablet  Every 8 hours PRN     03/27/18 1200    tamsulosin (FLOMAX) 0.4 MG CAPS capsule  Daily after supper     03/27/18 1200       Delia Heady, PA-C 03/27/18 1204    Fredia Sorrow, MD 03/28/18 [phone]

## 2018-03-27 NOTE — Discharge Instructions (Signed)
Follow-up with your urologist if symptoms do not improve in 4 to 5 days. Return to ED for fever, worsening pain, vomiting up blood and, lightheadedness or loss of consciousness.

## 2018-03-27 NOTE — ED Triage Notes (Signed)
Left flank pain onset 2 days ago; hx of kidney stones.

## 2018-03-27 NOTE — ED Notes (Signed)
Patient aware we need urine sample. Urinal at bedside.  

## 2018-03-29 ENCOUNTER — Other Ambulatory Visit: Payer: Self-pay

## 2018-03-29 ENCOUNTER — Emergency Department (HOSPITAL_COMMUNITY)
Admission: EM | Admit: 2018-03-29 | Discharge: 2018-03-29 | Disposition: A | Discharge status: Home / Self Care | Payer: BLUE CROSS/BLUE SHIELD | Attending: Emergency Medicine | Admitting: Emergency Medicine

## 2018-03-29 ENCOUNTER — Encounter (HOSPITAL_COMMUNITY): Payer: Self-pay | Admitting: *Deleted

## 2018-03-29 DIAGNOSIS — I10 Essential (primary) hypertension: Secondary | ICD-10-CM | POA: Diagnosis not present

## 2018-03-29 DIAGNOSIS — R1032 Left lower quadrant pain: Secondary | ICD-10-CM | POA: Diagnosis present

## 2018-03-29 DIAGNOSIS — N200 Calculus of kidney: Secondary | ICD-10-CM | POA: Insufficient documentation

## 2018-03-29 DIAGNOSIS — F17210 Nicotine dependence, cigarettes, uncomplicated: Secondary | ICD-10-CM | POA: Insufficient documentation

## 2018-03-29 DIAGNOSIS — Z79899 Other long term (current) drug therapy: Secondary | ICD-10-CM | POA: Insufficient documentation

## 2018-03-29 LAB — COMPREHENSIVE METABOLIC PANEL
ALBUMIN: 3.5 g/dL (ref 3.5–5.0)
ALT: 14 U/L (ref 0–44)
ANION GAP: 5 (ref 5–15)
AST: 12 U/L — ABNORMAL LOW (ref 15–41)
Alkaline Phosphatase: 71 U/L (ref 38–126)
BUN: 15 mg/dL (ref 8–23)
CHLORIDE: 102 mmol/L (ref 98–111)
CO2: 27 mmol/L (ref 22–32)
Calcium: 8.5 mg/dL — ABNORMAL LOW (ref 8.9–10.3)
Creatinine, Ser: 1.62 mg/dL — ABNORMAL HIGH (ref 0.61–1.24)
GFR calc non Af Amer: 44 mL/min — ABNORMAL LOW (ref >60)
GFR, EST AFRICAN AMERICAN: 51 mL/min — AB (ref >60)
GLUCOSE: 134 mg/dL — AB (ref 70–99)
Potassium: 4.2 mmol/L (ref 3.5–5.1)
SODIUM: 134 mmol/L — AB (ref 135–145)
Total Bilirubin: 1 mg/dL (ref 0.3–1.2)
Total Protein: 6.9 g/dL (ref 6.5–8.1)

## 2018-03-29 LAB — URINALYSIS, ROUTINE W REFLEX MICROSCOPIC
BILIRUBIN URINE: NEGATIVE
Bacteria, UA: NONE SEEN
Glucose, UA: NEGATIVE mg/dL
KETONES UR: NEGATIVE mg/dL
Leukocytes, UA: NEGATIVE
Nitrite: NEGATIVE
Protein, ur: NEGATIVE mg/dL
Specific Gravity, Urine: 1.013 (ref 1.005–1.030)
pH: 5 (ref 5.0–8.0)

## 2018-03-29 LAB — CBC
HEMATOCRIT: 40.7 % (ref 39.0–52.0)
HEMOGLOBIN: 13.9 g/dL (ref 13.0–17.0)
MCH: 31.7 pg (ref 26.0–34.0)
MCHC: 34.2 g/dL (ref 30.0–36.0)
MCV: 92.9 fL (ref 78.0–100.0)
Platelets: 232 10*3/uL (ref 150–400)
RBC: 4.38 MIL/uL (ref 4.22–5.81)
RDW: 12.8 % (ref 11.5–15.5)
WBC: 15.2 10*3/uL — ABNORMAL HIGH (ref 4.0–10.5)

## 2018-03-29 LAB — LIPASE, BLOOD: LIPASE: 31 U/L (ref 11–51)

## 2018-03-29 MED ORDER — DIAZEPAM 5 MG PO TABS
5.0000 mg | ORAL_TABLET | Freq: Once | ORAL | Status: AC
  Administered 2018-03-29: 5 mg via ORAL
  Filled 2018-03-29: qty 1

## 2018-03-29 MED ORDER — SODIUM CHLORIDE 0.9 % IV BOLUS
1000.0000 mL | Freq: Once | INTRAVENOUS | Status: AC
  Administered 2018-03-29: 1000 mL via INTRAVENOUS

## 2018-03-29 MED ORDER — KETOROLAC TROMETHAMINE 30 MG/ML IJ SOLN
15.0000 mg | Freq: Once | INTRAMUSCULAR | Status: AC
  Administered 2018-03-29: 15 mg via INTRAVENOUS
  Filled 2018-03-29: qty 1

## 2018-03-29 MED ORDER — MORPHINE SULFATE 15 MG PO TABS: 15.0000 mg | ORAL_TABLET | ORAL | 0 refills | Status: DC | PRN

## 2018-03-29 MED ORDER — HYDROMORPHONE HCL 1 MG/ML IJ SOLN
0.5000 mg | Freq: Once | INTRAMUSCULAR | Status: AC
  Administered 2018-03-29: 0.5 mg via INTRAVENOUS
  Filled 2018-03-29: qty 1

## 2018-03-29 NOTE — ED Notes (Addendum)
Discharge instructions reviewed with pt. Pt verbalized understanding. Pt to follow up with PCP. PIV removed. Pt ambulatory to waiting room.

## 2018-03-29 NOTE — Discharge Instructions (Signed)

## 2018-03-29 NOTE — ED Notes (Signed)
Bed: WA19 Expected date:  Expected time:  Means of arrival:  Comments: Triage 4 

## 2018-03-29 NOTE — ED Triage Notes (Signed)
Pt seen here Friday & was confirmed to have kidney stones.   Pain is more than the vicodin can help.  C/o left flank pain radiating around into abd.

## 2018-03-29 NOTE — ED Provider Notes (Signed)
[facility] DEPT Provider Note   CSN: [id number] Arrival date & time: 03/29/18  0054     History   Chief Complaint Chief Complaint  Patient presents with  . Flank Pain    left    HPI [name] [name] is a 62 y.o. male.  62 yo M with a chief complaint of left low back pain that radiates down the leg.  This been going on for the past week or so.  He was seen in the ED a couple days ago and diagnosed with a kidney stone.  He has been taking Percocet without improvement.  He stopped taking NSAIDs because he thought it would interact with the.  He denies loss of bladder or bowel denies loss of perirectal sensation.  He is been having trouble walking because of the severe pain to his leg.  He thinks he has the strength to do it just he is limited by pain.  Denies history of similar pain.  Denies fevers or chills.  Denies recent surgery.  The history is provided by the patient.  Illness  This is a new problem. The current episode started yesterday. The problem occurs constantly. The problem has not changed since onset.Pertinent negatives include no chest pain, no abdominal pain, no headaches and no shortness of breath. Nothing aggravates the symptoms. Nothing relieves the symptoms. He has tried nothing for the symptoms. The treatment provided no relief.    Past Medical History:  Diagnosis Date  . Benign prostatic hypertrophy   . GERD (gastroesophageal reflux disease)   . Gout   . Hyperlipidemia    no meds  . Hypertension     Patient Active Problem List   Diagnosis Date Noted  . Chronic left shoulder pain 11/07/2017  . Muscle cramp 03/01/2016  . Cough 01/30/2015  . Essential hypertension 01/30/2015  . Family history of colon cancer 01/30/2015  . Routine general medical examination at a health care facility 05/18/2012  . Prostate cancer screening 05/18/2012  . LATERAL EPICONDYLITIS 04/26/2010  . BACK PAIN, LUMBAR 07/07/2008  . NONSPECIFIC  MESENTERIC LYMPHADENITIS 06/08/2008  . COLONIC POLYPS 01/27/2008  . Cigarette smoker 01/27/2008  . FOOT PAIN, LEFT 01/27/2008  . SYNCOPE 01/27/2008  . FATIGUE 01/27/2008  . Hyperlipidemia 01/26/2008  . GOUT 01/26/2008  . GERD 01/26/2008  . BPH (benign prostatic hyperplasia) 01/26/2008    Past Surgical History:  Procedure Laterality Date  . APPENDECTOMY    . CARPAL TUNNEL RELEASE    . CHOLECYSTECTOMY    . COLONOSCOPY  04/2001 6/09   polyps  . liver hemangioma  9/09        Home Medications    Prior to Admission medications   Medication Sig Start Date End Date Taking? Authorizing Provider  amLODipine (NORVASC) 5 MG tablet TAKE 1 TABLET (5 MG TOTAL) BY MOUTH DAILY. 04/09/17   Tower, Wynelle Fanny, MD  ibuprofen (ADVIL,MOTRIN) 200 MG tablet Take 600 mg by mouth at bedtime as needed for mild pain.    [provider]  methocarbamol (ROBAXIN) 500 MG tablet Take 1 tablet (500 mg total) by mouth every 8 (eight) hours as needed for muscle spasms. May cause drowsiness. 11/07/17   Pleas Koch, NP  morphine (MSIR) 15 MG tablet Take 1 tablet (15 mg total) by mouth every 4 (four) hours as needed for severe pain. 03/29/18   Deno Etienne, DO  ondansetron (ZOFRAN ODT) 4 MG disintegrating tablet Take 1 tablet (4 mg total) by mouth every 8 (eight)  hours as needed for nausea or vomiting. 03/27/18   Khatri, Hina, PA-C  rosuvastatin (CRESTOR) 5 MG tablet Take 1 tablet (5 mg total) by mouth daily. 04/09/17   Tower, Wynelle Fanny, MD  tamsulosin (FLOMAX) 0.4 MG CAPS capsule Take 1 capsule (0.4 mg total) by mouth daily after supper. 03/27/18   Delia Heady, PA-C    Family History Family History  Problem Relation Age of Onset  . Cancer Father   . Colon cancer Father   . Heart disease Brother   . Esophageal cancer Neg Hx   . Stomach cancer Neg Hx   . Rectal cancer Neg Hx     Social History Social History   Tobacco Use  . Smoking status: Current Every Day Smoker    Packs/day: 1.00    Years: 44.00     Pack years: 44.00    Types: Cigarettes  . Smokeless tobacco: Never Used  Substance Use Topics  . Alcohol use: No    Alcohol/week: 0.0 oz  . Drug use: No     Allergies   Lipitor [atorvastatin]   Review of Systems Review of Systems  Respiratory: Negative for shortness of breath.   Cardiovascular: Negative for chest pain.  Gastrointestinal: Negative for abdominal pain.  Neurological: Negative for headaches.     Physical Exam Updated Vital Signs BP (!) 150/77 (BP Location: Left Arm)   Pulse 91   Temp 98.9 F (37.2 C) (Oral)   Resp 16   Ht 6' (1.829 m)   Wt 88.7 kg (195 lb 8 oz)   SpO2 95%   BMI 26.51 kg/m   Physical Exam  Constitutional: He is oriented to person, place, and time. He appears well-developed and well-nourished.  HENT:  Head: Normocephalic and atraumatic.  Eyes: Pupils are equal, round, and reactive to light. EOM are normal.  Neck: Normal range of motion. Neck supple. No JVD present.  Cardiovascular: Normal rate and regular rhythm. Exam reveals no gallop and no friction rub.  No murmur heard. Pulmonary/Chest: No respiratory distress. He has no wheezes.  Abdominal: He exhibits no distension. There is no tenderness. There is no rebound and no guarding.  Musculoskeletal: Normal range of motion. He exhibits edema and tenderness.  TTP about the left paraspinal musculature about L1-2.  Reproduces pain. - SLR test.  PMS intact distally.  No clonus, no hyperreflexia.   Neurological: He is alert and oriented to person, place, and time.  Skin: No rash noted. No pallor.  Psychiatric: He has a normal mood and affect. His behavior is normal.  Nursing note and vitals reviewed.    ED Treatments / Results  Labs (all labs ordered are listed, but only abnormal results are displayed) Labs Reviewed  URINALYSIS, ROUTINE W REFLEX MICROSCOPIC - Abnormal; Notable for the following components:      Result Value   Hgb urine dipstick SMALL (*)    All other components  within normal limits  COMPREHENSIVE METABOLIC PANEL - Abnormal; Notable for the following components:   Sodium 134 (*)    Glucose, Bld 134 (*)    Creatinine, Ser 1.62 (*)    Calcium 8.5 (*)    AST 12 (*)    GFR calc non Af Amer 44 (*)    GFR calc Af Amer 51 (*)    All other components within normal limits  CBC - Abnormal; Notable for the following components:   WBC 15.2 (*)    All other components within normal limits  LIPASE, BLOOD  EKG None  Radiology Ct Renal Stone Study  Result Date: 03/27/2018 CLINICAL DATA:  Left flank pain onset 2 days ago; hx of kidney stones. EXAM: CT ABDOMEN AND PELVIS WITHOUT CONTRAST TECHNIQUE: Multidetector CT imaging of the abdomen and pelvis was performed following the standard protocol without IV contrast. COMPARISON:  12/10/2017 FINDINGS: Lower chest: Coronary calcifications. No pleural or pericardial effusion. Hepatobiliary: No focal liver abnormality is seen. Status post cholecystectomy. No biliary dilatation. Pancreas: Unremarkable. No pancreatic ductal dilatation or surrounding inflammatory changes. Spleen: Normal in size without focal abnormality. Accessory splenule. Adrenals/Urinary Tract: Normal adrenal glands. Right kidney unremarkable. 5.1 cm exophytic complex cyst lesion from the midpole left kidney, as previously evaluated. New left mild hydronephrosis and ureterectasis down to the level of a 5 mm calculus just proximal to the ureteral orifice. Moderate inflammatory/edematous changes around the left kidney and proximal ureter. No other nephrolithiasis. Urinary bladder incompletely distended. Stomach/Bowel: Stomach and small bowel are nondilated. Appendix surgically absent. Scattered diverticula from descending and sigmoid portions of the colon without adjacent inflammatory/edematous change or abscess. Vascular/Lymphatic: Scattered aortoiliac arterial calcifications without aneurysm. No abdominal or pelvic adenopathy localized. Reproductive: Mild  prostatic enlargement. Other: No ascites.  No free air. Musculoskeletal: No acute or significant osseous findings. IMPRESSION: 1.  5 mm distal left ureteral calculus with mild hydronephrosis. 2. Coronary and aortoiliac atherosclerosis (ICD10-170.0). 3. Descending and sigmoid diverticulosis. Electronically Signed   By: Lucrezia Europe M.D.   On: 03/27/2018 09:31    Procedures Procedures (including critical care time)  Medications Ordered in ED Medications  sodium chloride 0.9 % bolus 1,000 mL (0 mLs Intravenous Stopped 03/29/18 0446)  HYDROmorphone (DILAUDID) injection 0.5 mg (0.5 mg Intravenous Given 03/29/18 0344)  ketorolac (TORADOL) 30 MG/ML injection 15 mg (15 mg Intravenous Given 03/29/18 0344)  diazepam (VALIUM) tablet 5 mg (5 mg Oral Given 03/29/18 0428)     Initial Impression / Assessment and Plan / ED Course  I have reviewed the triage vital signs and the nursing notes.  Pertinent labs & imaging results that were available during my care of the patient were reviewed by me and considered in my medical decision making (see chart for details).     62 yo M with a chief complaint of left-sided low back pain that radiates down the leg.  This is worse with movement and palpation.  He was seen 2 days ago and diagnosed with a kidney stone.  This seems much less likely to be a kidney stone than it does sciatica.  Either way his urine is negative for infection.  We will treat his pain here.  PCP and urology follow-up.  6:24 AM:  I have discussed the diagnosis/risks/treatment options with the patient and family and believe the pt to be eligible for discharge home to follow-up with PCP, urology. We also discussed returning to the ED immediately if new or worsening sx occur. We discussed the sx which are most concerning (e.g., sudden worsening pain, fever, inability to tolerate by mouth, cauda equina s/sx) that necessitate immediate return. Medications administered to the patient during their visit and any new  prescriptions provided to the patient are listed below.  Medications given during this visit Medications  sodium chloride 0.9 % bolus 1,000 mL (0 mLs Intravenous Stopped 03/29/18 0446)  HYDROmorphone (DILAUDID) injection 0.5 mg (0.5 mg Intravenous Given 03/29/18 0344)  ketorolac (TORADOL) 30 MG/ML injection 15 mg (15 mg Intravenous Given 03/29/18 0344)  diazepam (VALIUM) tablet 5 mg (5 mg Oral Given 03/29/18 0428)  The patient appears reasonably screen and/or stabilized for discharge and I doubt any other medical condition or other [facility] requiring further screening, evaluation, or treatment in the ED at this time prior to discharge.    Final Clinical Impressions(s) / ED Diagnoses   Final diagnoses:  Nephrolithiasis    ED Discharge Orders        Ordered    morphine (MSIR) 15 MG tablet  Every 4 hours PRN     03/29/18 [place], [place], DO 03/29/18 4591

## 2018-04-16 ENCOUNTER — Other Ambulatory Visit: Payer: Self-pay | Admitting: *Deleted

## 2018-04-16 NOTE — Telephone Encounter (Signed)
Please schedule f/u and refill until then  

## 2018-04-16 NOTE — Telephone Encounter (Signed)
No recent or future appts., please advise  

## 2018-04-17 MED ORDER — AMLODIPINE BESYLATE 5 MG PO TABS: ORAL_TABLET | ORAL | 0 refills | Status: DC

## 2018-04-17 NOTE — Telephone Encounter (Signed)
Med filled and [name] will reach out to pt to try and get f/u scheduled

## 2018-04-27 ENCOUNTER — Encounter: Payer: Self-pay | Admitting: Family Medicine

## 2018-04-27 ENCOUNTER — Ambulatory Visit: Payer: BLUE CROSS/BLUE SHIELD | Admitting: Family Medicine

## 2018-04-27 VITALS — BP 148/76 | HR 78 | Temp 98.0°F | Ht 69.0 in | Wt 196.2 lb

## 2018-04-27 DIAGNOSIS — N401 Enlarged prostate with lower urinary tract symptoms: Secondary | ICD-10-CM

## 2018-04-27 DIAGNOSIS — Z87442 Personal history of urinary calculi: Secondary | ICD-10-CM

## 2018-04-27 DIAGNOSIS — F17210 Nicotine dependence, cigarettes, uncomplicated: Secondary | ICD-10-CM | POA: Diagnosis not present

## 2018-04-27 DIAGNOSIS — E7800 Pure hypercholesterolemia, unspecified: Secondary | ICD-10-CM | POA: Diagnosis not present

## 2018-04-27 DIAGNOSIS — I10 Essential (primary) hypertension: Secondary | ICD-10-CM

## 2018-04-27 LAB — COMPREHENSIVE METABOLIC PANEL
ALBUMIN: 4 g/dL (ref 3.5–5.2)
ALT: 13 U/L (ref 0–53)
AST: 11 U/L (ref 0–37)
Alkaline Phosphatase: 81 U/L (ref 39–117)
BUN: 9 mg/dL (ref 6–23)
CALCIUM: 9.1 mg/dL (ref 8.4–10.5)
CO2: 28 mEq/L (ref 19–32)
CREATININE: 0.86 mg/dL (ref 0.40–1.50)
Chloride: 109 mEq/L (ref 96–112)
GFR: 95.72 mL/min (ref >60.00)
Glucose, Bld: 115 mg/dL — ABNORMAL HIGH (ref 70–99)
POTASSIUM: 3.9 meq/L (ref 3.5–5.1)
Sodium: 142 mEq/L (ref 135–145)
Total Bilirubin: 0.3 mg/dL (ref 0.2–1.2)
Total Protein: 6.4 g/dL (ref 6.0–8.3)

## 2018-04-27 LAB — LIPID PANEL
CHOLESTEROL: 141 mg/dL (ref 0–200)
HDL: 26.9 mg/dL — ABNORMAL LOW (ref >39.00)
LDL CALC: 83 mg/dL (ref 0–99)
NONHDL: 114.42
TRIGLYCERIDES: 158 mg/dL — AB (ref 0.0–149.0)
Total CHOL/HDL Ratio: 5
VLDL: 31.6 mg/dL (ref 0.0–40.0)

## 2018-04-27 LAB — CBC WITH DIFFERENTIAL/PLATELET
Basophils Absolute: 0.1 10*3/uL (ref 0.0–0.1)
Basophils Relative: 0.9 % (ref 0.0–3.0)
Eosinophils Absolute: 0.2 10*3/uL (ref 0.0–0.7)
Eosinophils Relative: 1.8 % (ref 0.0–5.0)
HEMATOCRIT: 42.9 % (ref 39.0–52.0)
HEMOGLOBIN: 14.8 g/dL (ref 13.0–17.0)
Lymphocytes Relative: 18 % (ref 12.0–46.0)
Lymphs Abs: 1.7 10*3/uL (ref 0.7–4.0)
MCHC: 34.4 g/dL (ref 30.0–36.0)
MCV: 91.5 fl (ref 78.0–100.0)
Monocytes Absolute: 0.5 10*3/uL (ref 0.1–1.0)
Monocytes Relative: 5.5 % (ref 3.0–12.0)
Neutro Abs: 6.9 10*3/uL (ref 1.4–7.7)
Neutrophils Relative %: 73.8 % (ref 43.0–77.0)
Platelets: 228 10*3/uL (ref 150.0–400.0)
RBC: 4.69 Mil/uL (ref 4.22–5.81)
RDW: 13.3 % (ref 11.5–15.5)
WBC: 9.4 10*3/uL (ref 4.0–10.5)

## 2018-04-27 LAB — PSA: PSA: 1.02 ng/mL (ref 0.10–4.00)

## 2018-04-27 LAB — TSH: TSH: 1.87 u[IU]/mL (ref 0.35–4.50)

## 2018-04-27 MED ORDER — AMLODIPINE BESYLATE 10 MG PO TABS: 10.0000 mg | ORAL_TABLET | Freq: Every day | ORAL | 3 refills | Status: DC

## 2018-04-27 MED ORDER — ROSUVASTATIN CALCIUM 5 MG PO TABS: 5.0000 mg | ORAL_TABLET | Freq: Every day | ORAL | 3 refills | Status: DC

## 2018-04-27 NOTE — Progress Notes (Signed)
Subjective:    Patient ID: [name] [name], male    DOB: 02/20/1956, 62 y.o.   MRN: [id number]  HPI  Here for f/u of chronic health problems   Wt Readings from Last 3 Encounters:  04/27/18 196 lb 4 oz (89 kg)  03/29/18 195 lb 8 oz (88.7 kg)  03/27/18 193 lb (87.5 kg)  he eats regularly but not healthy  Gets enough fluids  28.98 kg/m   Smoking status - 1ppd or less Breathing - not great / bothers him at night - gets some wheezing  He knows he has to quit but not ready  chantix was too stimulating-did not tolerate  Has some nicotine patches-will consider it   bp is mildly elevated today No cp or palpitations or headaches or edema  No side effects to medicines  BP Readings from Last 3 Encounters:  04/27/18 (!) 148/76  03/29/18 (!) 150/77  03/27/18 127/68    Amlodipine 5 mg  It has been high at home - usually 263 mg systolic    BPH Lab Results  Component Value Date   PSA 0.8 04/09/2017   PSA 1.10 01/30/2015   PSA 1.01 05/18/2012    Had a recent ED visit for kidney stone  Went twice  Does not want to go to urologist  Still a little L low back pain  (had 5 mm stone in distal ureter)  Taking ibuprofen he is in less pain than he was  Still has urgency once in a while  No burning to urinate  He still takes flomax as needed   Results for orders placed or performed during the hospital encounter of 03/29/18  Urinalysis, Routine w reflex microscopic- may I&O cath if menses  Result Value Ref Range   Color, Urine YELLOW YELLOW   APPearance CLEAR CLEAR   Specific Gravity, Urine 1.013 1.005 - 1.030   pH 5.0 5.0 - 8.0   Glucose, UA NEGATIVE NEGATIVE mg/dL   Hgb urine dipstick SMALL (A) NEGATIVE   Bilirubin Urine NEGATIVE NEGATIVE   Ketones, ur NEGATIVE NEGATIVE mg/dL   Protein, ur NEGATIVE NEGATIVE mg/dL   Nitrite NEGATIVE NEGATIVE   Leukocytes, UA NEGATIVE NEGATIVE   RBC / HPF 11-20 0 - 5 RBC/hpf   WBC, UA 0-5 0 - 5 WBC/hpf   Bacteria, UA NONE SEEN NONE SEEN   Squamous Epithelial / LPF 0-5 0 - 5   Mucus PRESENT    Hyaline Casts, UA PRESENT   Lipase, blood  Result Value Ref Range   Lipase 31 11 - 51 U/L  Comprehensive metabolic panel  Result Value Ref Range   Sodium 134 (L) 135 - 145 mmol/L   Potassium 4.2 3.5 - 5.1 mmol/L   Chloride 102 98 - 111 mmol/L   CO2 27 22 - 32 mmol/L   Glucose, Bld 134 (H) 70 - 99 mg/dL   BUN 15 8 - 23 mg/dL   Creatinine, Ser 1.62 (H) 0.61 - 1.24 mg/dL   Calcium 8.5 (L) 8.9 - 10.3 mg/dL   Total Protein 6.9 6.5 - 8.1 g/dL   Albumin 3.5 3.5 - 5.0 g/dL   AST 12 (L) 15 - 41 U/L   ALT 14 0 - 44 U/L   Alkaline Phosphatase 71 38 - 126 U/L   Total Bilirubin 1.0 0.3 - 1.2 mg/dL   GFR calc non Af Amer 44 (L) >60 mL/min   GFR calc Af Amer 51 (L) >60 mL/min   Anion gap 5 5 - 15  CBC  Result Value Ref Range   WBC 15.2 (H) 4.0 - 10.5 K/uL   RBC 4.38 4.22 - 5.81 MIL/uL   Hemoglobin 13.9 13.0 - 17.0 g/dL   HCT 40.7 39.0 - 52.0 %   MCV 92.9 78.0 - 100.0 fL   MCH 31.7 26.0 - 34.0 pg   MCHC 34.2 30.0 - 36.0 g/dL   RDW 12.8 11.5 - 15.5 %   Platelets 232 150 - 400 K/uL     Hyperlipidemia Lab Results  Component Value Date   CHOL 159 04/09/2017   HDL 21 (L) 04/09/2017   LDLCALC 64 04/09/2017   LDLDIRECT 128.0 03/01/2016   TRIG 371 (H) 04/09/2017   CHOLHDL 7.6 (H) 04/09/2017   crestor and diet  Due for labs    Patient Active Problem List   Diagnosis Date Noted  . History of kidney stones 04/27/2018  . Chronic left shoulder pain 11/07/2017  . Cough 01/30/2015  . Essential hypertension 01/30/2015  . Family history of colon cancer 01/30/2015  . Routine general medical examination at a health care facility 05/18/2012  . Prostate cancer screening 05/18/2012  . LATERAL EPICONDYLITIS 04/26/2010  . BACK PAIN, LUMBAR 07/07/2008  . NONSPECIFIC MESENTERIC LYMPHADENITIS 06/08/2008  . COLONIC POLYPS 01/27/2008  . Cigarette smoker 01/27/2008  . FOOT PAIN, LEFT 01/27/2008  . SYNCOPE 01/27/2008  . FATIGUE 01/27/2008    . Hyperlipidemia 01/26/2008  . GOUT 01/26/2008  . GERD 01/26/2008  . BPH (benign prostatic hyperplasia) 01/26/2008   Past Medical History:  Diagnosis Date  . Benign prostatic hypertrophy   . GERD (gastroesophageal reflux disease)   . Gout   . Hyperlipidemia    no meds  . Hypertension    Past Surgical History:  Procedure Laterality Date  . APPENDECTOMY    . CARPAL TUNNEL RELEASE    . CHOLECYSTECTOMY    . COLONOSCOPY  04/2001 6/09   polyps  . liver hemangioma  9/09   Social History   Tobacco Use  . Smoking status: Current Every Day Smoker    Packs/day: 1.00    Years: 44.00    Pack years: 44.00    Types: Cigarettes  . Smokeless tobacco: Never Used  Substance Use Topics  . Alcohol use: No    Alcohol/week: 0.0 oz  . Drug use: No   Family History  Problem Relation Age of Onset  . Cancer Father   . Colon cancer Father   . Heart disease Brother   . Esophageal cancer Neg Hx   . Stomach cancer Neg Hx   . Rectal cancer Neg Hx    Allergies  Allergen Reactions  . Lipitor [Atorvastatin]     Leg pain   Current Outpatient Medications on File Prior to Visit  Medication Sig Dispense Refill  . ibuprofen (ADVIL,MOTRIN) 200 MG tablet Take 600 mg by mouth at bedtime as needed for mild pain.    . methocarbamol (ROBAXIN) 500 MG tablet Take 1 tablet (500 mg total) by mouth every 8 (eight) hours as needed for muscle spasms. May cause drowsiness. 15 tablet 0  . ondansetron (ZOFRAN ODT) 4 MG disintegrating tablet Take 1 tablet (4 mg total) by mouth every 8 (eight) hours as needed for nausea or vomiting. 4 tablet 0  . tamsulosin (FLOMAX) 0.4 MG CAPS capsule Take 1 capsule (0.4 mg total) by mouth daily after supper. 30 capsule 0   No current facility-administered medications on file prior to visit.      Review of Systems  Constitutional: Negative  for activity change, appetite change, fatigue, fever and unexpected weight change.       Well appearing   HENT: Negative for congestion,  rhinorrhea, sore throat and trouble swallowing.   Eyes: Negative for pain, redness, itching and visual disturbance.  Respiratory: Negative for cough, chest tightness, shortness of breath and wheezing.   Cardiovascular: Negative for chest pain, palpitations and leg swelling.  Gastrointestinal: Negative for abdominal pain, blood in stool, constipation, diarrhea and nausea.  Endocrine: Negative for cold intolerance, heat intolerance, polydipsia and polyuria.  Genitourinary: Negative for difficulty urinating, dysuria, frequency and urgency.  Musculoskeletal: Positive for back pain. Negative for arthralgias, joint swelling and myalgias.  Skin: Negative for pallor and rash.       One episode of hives- thinks he may be allergic to watermelon  Neurological: Negative for dizziness, tremors, weakness, numbness and headaches.  Hematological: Negative for adenopathy. Does not bruise/bleed easily.  Psychiatric/Behavioral: Negative for decreased concentration and dysphoric mood. The patient is not nervous/anxious.        Objective:   Physical Exam  Constitutional: He appears well-developed and well-nourished. No distress.  Well appearing   HENT:  Head: Normocephalic and atraumatic.  Nose: Nose normal.  Mouth/Throat: Oropharynx is clear and moist.  Eyes: Pupils are equal, round, and reactive to light. Conjunctivae and EOM are normal.  Neck: Normal range of motion. Neck supple. No JVD present. Carotid bruit is not present. No thyromegaly present.  Cardiovascular: Normal rate, regular rhythm, normal heart sounds and intact distal pulses. Exam reveals no gallop.  Pulmonary/Chest: Effort normal and breath sounds normal. No stridor. No respiratory distress. He has no wheezes. He has no rales.  No crackles  Diffusely distant bs No wheeze or prolonged exp phase  Abdominal: Soft. Bowel sounds are normal. He exhibits no distension, no abdominal bruit and no mass. There is no tenderness.  No tenderness No  suprapubic tenderness or fullness   No cva tenderness   Musculoskeletal: He exhibits no edema or tenderness.  Nl rom LS  Lymphadenopathy:    He has no cervical adenopathy.  Neurological: He is alert. He has normal reflexes. He displays normal reflexes. No cranial nerve deficit. Coordination normal.  Skin: Skin is warm and dry. No rash noted.  Very tanned   Psychiatric: He has a normal mood and affect.          Assessment & Plan:   Problem List Items Addressed This Visit      Cardiovascular and Mediastinum   Essential hypertension - Primary    bp in fair control at this time  BP Readings from Last 1 Encounters:  04/27/18 (!) 148/76   Will increase amlodipine from 5 to 10 mg daily (alert if side effects) Most recent labs reviewed  Disc lifstyle change with low sodium diet and exercise  Labs today      Relevant Medications   amLODipine (NORVASC) 10 MG tablet   rosuvastatin (CRESTOR) 5 MG tablet   Other Relevant Orders   CBC with Differential/Platelet   Comprehensive metabolic panel   Lipid panel   TSH     Genitourinary   BPH (benign prostatic hyperplasia)    No nocturia or urinary hesitance  He takes prn flomax for kidney stones /not bph PSA today      Relevant Orders   PSA     Other   Cigarette smoker    Disc in detail risks of smoking and possible outcomes including copd, vascular/ heart disease, cancer , respiratory and sinus  infections  Pt voices understanding Pt continues to have occ wheeze at night  Wants to quit-but not ready  Intol of chantix  Has tried nicotine patch       History of kidney stones    Recently passed one on the L  Rev ED lab and visit  Cr and wbc up- will re check this   His L low back soreness is likely from MSK etiology (improved)      Hyperlipidemia    Due for labs on crestor and diet  Disc goals for lipids and reasons to control them Rev last labs with pt Rev low sat fat diet in detail Cholesterol panel today       Relevant Medications   amLODipine (NORVASC) 10 MG tablet   rosuvastatin (CRESTOR) 5 MG tablet   Other Relevant Orders   Lipid panel

## 2018-04-27 NOTE — Assessment & Plan Note (Signed)
bp in fair control at this time  BP Readings from Last 1 Encounters:  04/27/18 (!) 148/76   Will increase amlodipine from 5 to 10 mg daily (alert if side effects) Most recent labs reviewed  Disc lifstyle change with low sodium diet and exercise  Labs today

## 2018-04-27 NOTE — Assessment & Plan Note (Signed)
Due for labs on crestor and diet  Disc goals for lipids and reasons to control them Rev last labs with pt Rev low sat fat diet in detail Cholesterol panel today

## 2018-04-27 NOTE — Assessment & Plan Note (Signed)
No nocturia or urinary hesitance  He takes prn flomax for kidney stones /not bph PSA today

## 2018-04-27 NOTE — Assessment & Plan Note (Signed)
Disc in detail risks of smoking and possible outcomes including copd, vascular/ heart disease, cancer , respiratory and sinus infections  Pt voices understanding Pt continues to have occ wheeze at night  Wants to quit-but not ready  Intol of chantix  Has tried nicotine patch

## 2018-04-27 NOTE — Assessment & Plan Note (Signed)
Recently passed one on the L  Rev ED lab and visit  Cr and wbc up- will re check this   His L low back soreness is likely from MSK etiology (improved)

## 2018-04-27 NOTE — Patient Instructions (Addendum)
Try to get at least 64 oz of fluids per day / some or most of it water  Keep thinking about quitting smoking  Labs today   Increase your amlodipine from 5 to 10 mg once daily for blood pressure  You can double up on what you have first   Follow up in 2-3 months  Take care of yourself

## 2018-04-28 ENCOUNTER — Encounter: Payer: Self-pay | Admitting: *Deleted

## 2019-02-10 DIAGNOSIS — A059 Bacterial foodborne intoxication, unspecified: Secondary | ICD-10-CM | POA: Diagnosis not present

## 2019-02-10 DIAGNOSIS — Z9189 Other specified personal risk factors, not elsewhere classified: Secondary | ICD-10-CM | POA: Diagnosis not present

## 2019-06-21 ENCOUNTER — Other Ambulatory Visit: Payer: Self-pay | Admitting: *Deleted

## 2019-06-21 NOTE — Telephone Encounter (Signed)
No recent or future appts.(hasn't been seen in over a year), please advise   [facility]

## 2019-06-21 NOTE — Telephone Encounter (Signed)
Please schedule f/u or PE (his pref) and refill until then Thanks

## 2019-06-22 MED ORDER — AMLODIPINE BESYLATE 10 MG PO TABS: 10.0000 mg | ORAL_TABLET | Freq: Every day | ORAL | 0 refills | Status: DC

## 2019-06-22 MED ORDER — ROSUVASTATIN CALCIUM 5 MG PO TABS: 5.0000 mg | ORAL_TABLET | Freq: Every day | ORAL | 0 refills | Status: DC

## 2019-06-22 NOTE — Telephone Encounter (Signed)
Med refilled once and Carrie will reach out to pt to try and get CPE scheduled  

## 2019-08-13 ENCOUNTER — Other Ambulatory Visit: Payer: Self-pay

## 2019-08-13 ENCOUNTER — Encounter: Payer: Self-pay | Admitting: Family Medicine

## 2019-08-13 ENCOUNTER — Ambulatory Visit (INDEPENDENT_AMBULATORY_CARE_PROVIDER_SITE_OTHER): Payer: BLUE CROSS/BLUE SHIELD | Admitting: Family Medicine

## 2019-08-13 VITALS — BP 131/70 | HR 78 | Temp 97.5°F | Ht 71.0 in | Wt 186.1 lb

## 2019-08-13 DIAGNOSIS — Z87442 Personal history of urinary calculi: Secondary | ICD-10-CM

## 2019-08-13 DIAGNOSIS — E7800 Pure hypercholesterolemia, unspecified: Secondary | ICD-10-CM

## 2019-08-13 DIAGNOSIS — Z23 Encounter for immunization: Secondary | ICD-10-CM | POA: Diagnosis not present

## 2019-08-13 DIAGNOSIS — N401 Enlarged prostate with lower urinary tract symptoms: Secondary | ICD-10-CM

## 2019-08-13 DIAGNOSIS — I10 Essential (primary) hypertension: Secondary | ICD-10-CM

## 2019-08-13 DIAGNOSIS — N486 Induration penis plastica: Secondary | ICD-10-CM | POA: Insufficient documentation

## 2019-08-13 DIAGNOSIS — F17210 Nicotine dependence, cigarettes, uncomplicated: Secondary | ICD-10-CM

## 2019-08-13 DIAGNOSIS — Z0000 Encounter for general adult medical examination without abnormal findings: Secondary | ICD-10-CM

## 2019-08-13 DIAGNOSIS — Z800 Family history of malignant neoplasm of digestive organs: Secondary | ICD-10-CM

## 2019-08-13 DIAGNOSIS — R634 Abnormal weight loss: Secondary | ICD-10-CM

## 2019-08-13 LAB — CBC WITH DIFFERENTIAL/PLATELET
Basophils Absolute: 0.1 10*3/uL (ref 0.0–0.1)
Basophils Relative: 1.2 % (ref 0.0–3.0)
Eosinophils Absolute: 0.1 10*3/uL (ref 0.0–0.7)
Eosinophils Relative: 1.3 % (ref 0.0–5.0)
HCT: 44.4 % (ref 39.0–52.0)
Hemoglobin: 14.9 g/dL (ref 13.0–17.0)
Lymphocytes Relative: 25.6 % (ref 12.0–46.0)
Lymphs Abs: 1.6 10*3/uL (ref 0.7–4.0)
MCHC: 33.6 g/dL (ref 30.0–36.0)
MCV: 93.8 fl (ref 78.0–100.0)
Monocytes Absolute: 0.4 10*3/uL (ref 0.1–1.0)
Monocytes Relative: 6.1 % (ref 3.0–12.0)
Neutro Abs: 4 10*3/uL (ref 1.4–7.7)
Neutrophils Relative %: 65.8 % (ref 43.0–77.0)
Platelets: 256 10*3/uL (ref 150.0–400.0)
RBC: 4.73 Mil/uL (ref 4.22–5.81)
RDW: 13.4 % (ref 11.5–15.5)
WBC: 6.1 10*3/uL (ref 4.0–10.5)

## 2019-08-13 LAB — COMPREHENSIVE METABOLIC PANEL
ALT: 15 U/L (ref 0–53)
AST: 15 U/L (ref 0–37)
Albumin: 4.3 g/dL (ref 3.5–5.2)
Alkaline Phosphatase: 90 U/L (ref 39–117)
BUN: 9 mg/dL (ref 6–23)
CO2: 27 mEq/L (ref 19–32)
Calcium: 9 mg/dL (ref 8.4–10.5)
Chloride: 107 mEq/L (ref 96–112)
Creatinine, Ser: 0.84 mg/dL (ref 0.40–1.50)
GFR: 92.15 mL/min (ref >60.00)
Glucose, Bld: 105 mg/dL — ABNORMAL HIGH (ref 70–99)
Potassium: 4.7 mEq/L (ref 3.5–5.1)
Sodium: 140 mEq/L (ref 135–145)
Total Bilirubin: 0.4 mg/dL (ref 0.2–1.2)
Total Protein: 6.6 g/dL (ref 6.0–8.3)

## 2019-08-13 LAB — LIPID PANEL
Cholesterol: 148 mg/dL (ref 0–200)
HDL: 28.3 mg/dL — ABNORMAL LOW (ref >39.00)
LDL Cholesterol: 102 mg/dL — ABNORMAL HIGH (ref 0–99)
NonHDL: 119.67
Total CHOL/HDL Ratio: 5
Triglycerides: 86 mg/dL (ref 0.0–149.0)
VLDL: 17.2 mg/dL (ref 0.0–40.0)

## 2019-08-13 LAB — PSA: PSA: 1.08 ng/mL (ref 0.10–4.00)

## 2019-08-13 LAB — TSH: TSH: 1.16 u[IU]/mL (ref 0.35–4.50)

## 2019-08-13 MED ORDER — ROSUVASTATIN CALCIUM 5 MG PO TABS: 5.0000 mg | ORAL_TABLET | Freq: Every day | ORAL | 3 refills | Status: DC

## 2019-08-13 MED ORDER — AMLODIPINE BESYLATE 10 MG PO TABS: 10.0000 mg | ORAL_TABLET | Freq: Every day | ORAL | 3 refills | Status: DC

## 2019-08-13 NOTE — Patient Instructions (Signed)
Please keep working on quitting smoking  Let us know if you have any breathing changes   Watch your weight- if you continue to loose for no reason please let us know  Let us know if you are ever interested in the lung cancer screening program   Labs today  If you change your mind about a flu vaccine please let us know

## 2019-08-13 NOTE — Assessment & Plan Note (Signed)
Pt continues to take flomax Enc good water intake Does not want to return to urology at this time

## 2019-08-13 NOTE — Assessment & Plan Note (Addendum)
Reviewed health habits including diet and exercise and skin cancer prevention Reviewed appropriate screening tests for age  Also reviewed health mt list, fam hx and immunization status , as well as social and family history   See HPI Labs ordered pna 23 updated He continues to decline a flu shot-enc him to let us know if he re considers  (high risk smoker) Colonoscopy due 7/21-he is aware  Enc to continue to think about smoking cessation  Has lost wt likely due to decreased po intake -but need to watch this (told to weigh at home) , also given info on the [facility] lung cancer screening program to consider  Recommended derm visit or f/u for skin cancer screening and eval of poss aks on arms-he declines at this time

## 2019-08-13 NOTE — Assessment & Plan Note (Signed)
Pt has been to urology Does not req a f/u

## 2019-08-13 NOTE — Assessment & Plan Note (Signed)
occ urgency -no other c/o  No family hx  psa drawn Pt has not been to urology lately

## 2019-08-13 NOTE — Assessment & Plan Note (Signed)
Due for colonoscopy 5 y recall 7/21 Pt is aware

## 2019-08-13 NOTE — Progress Notes (Signed)
Subjective:    Patient ID: [name] [name], male    DOB: 05/27/1956, 63 y.o.   MRN: [id number]  This visit occurred during the SARS-CoV-2 public health emergency.  Safety protocols were in place, including screening questions prior to the visit, additional usage of staff PPE, and extensive cleaning of exam room while observing appropriate contact time as indicated for disinfecting solutions.   This visit occurred during the SARS-CoV-2 public health emergency.  Safety protocols were in place, including screening questions prior to the visit, additional usage of staff PPE, and extensive cleaning of exam room while observing appropriate contact time as indicated for disinfecting solutions.    HPI Here for health maintenance exam and to review chronic medical problems   Feeling good overall  Working a lot    [employer] from Last 3 Encounters:  08/13/19 186 lb 2 oz (84.4 kg)  04/27/18 196 lb 4 oz (89 kg)  03/29/18 195 lb 8 oz (88.7 kg)  taking care of himself  Not trying to loose weight  Eats twice daily - then crackers in between  (used to eat lunch but not really now) -- appetite is fine/ he is too busy to eat lunch  Not exercising more  25.96 kg/m   Usually looses wt in the summer time  In the winter he eats more   Flu vaccine -declines   Colonoscopy 7/16 with 5 y recall  Will be due in July 2021   Tdap 5/16  pna 23 vaccine 8/13  Will update this year   Smoking - 1 ppd  He cut back a lot to 5 cig per day  Started back when he is stressed   He tried nicotine patches - did not tolerate them  Does not want help  Has tried chantix   He has smokers cough in am / and when he lies down initially  No sob Does wheeze    BPH-h/o Lab Results  Component Value Date   PSA 1.02 04/27/2018   PSA 0.8 04/09/2017   PSA 1.10 01/30/2015    due for labs today Taking flomax (more for kidney stones) No problems with urination  No problems with stream   Does have a bent  penis shape  Urology was unable to help  peyronies dz   Has had some bad episodes with kidney stones  Has not seen urology lately - his urologist left  Does not want an appt    bp is stable today  No cp or palpitations or headaches or edema  No side effects to medicines  BP Readings from Last 3 Encounters:  08/13/19 (!) 148/76  04/27/18 (!) 148/76  03/29/18 (!) 150/77    Better on 2nd check BP: 131/70    Hyperlipidemia Due for labs Lab Results  Component Value Date   CHOL 141 04/27/2018   HDL 26.90 (L) 04/27/2018   LDLCALC 83 04/27/2018   LDLDIRECT 128.0 03/01/2016   TRIG 158.0 (H) 04/27/2018   CHOLHDL 5 04/27/2018   crestor and diet currently Does not eat the best    Patient Active Problem List   Diagnosis Date Noted  . Peyronie's disease 08/13/2019  . Weight loss 08/13/2019  . History of kidney stones 04/27/2018  . Chronic left shoulder pain 11/07/2017  . Cough 01/30/2015  . Essential hypertension 01/30/2015  . Family history of colon cancer 01/30/2015  . Routine general medical examination at a health care facility 05/18/2012  . Prostate cancer screening 05/18/2012  . LATERAL  EPICONDYLITIS 04/26/2010  . BACK PAIN, LUMBAR 07/07/2008  . NONSPECIFIC MESENTERIC LYMPHADENITIS 06/08/2008  . COLONIC POLYPS 01/27/2008  . Cigarette smoker 01/27/2008  . FOOT PAIN, LEFT 01/27/2008  . SYNCOPE 01/27/2008  . FATIGUE 01/27/2008  . Hyperlipidemia 01/26/2008  . GOUT 01/26/2008  . GERD 01/26/2008  . BPH (benign prostatic hyperplasia) 01/26/2008   Past Medical History:  Diagnosis Date  . Benign prostatic hypertrophy   . GERD (gastroesophageal reflux disease)   . Gout   . Hyperlipidemia    no meds  . Hypertension    Past Surgical History:  Procedure Laterality Date  . APPENDECTOMY    . CARPAL TUNNEL RELEASE    . CHOLECYSTECTOMY    . COLONOSCOPY  04/2001 6/09   polyps  . liver hemangioma  9/09   Social History   Tobacco Use  . Smoking status: Current Every  Day Smoker    Packs/day: 1.00    Years: 44.00    Pack years: 44.00    Types: Cigarettes  . Smokeless tobacco: Never Used  Substance Use Topics  . Alcohol use: No    Alcohol/week: 0.0 standard drinks  . Drug use: No   Family History  Problem Relation Age of Onset  . Cancer Father   . Colon cancer Father   . Heart disease Brother   . Esophageal cancer Neg Hx   . Stomach cancer Neg Hx   . Rectal cancer Neg Hx    Allergies  Allergen Reactions  . Chantix [Varenicline Tartrate]     Insomnia   . Lipitor [Atorvastatin]     Leg pain  . Watermelon [Citrullus Vulgaris]     Thinks it gave  Him hives   Current Outpatient Medications on File Prior to Visit  Medication Sig Dispense Refill  . ibuprofen (ADVIL,MOTRIN) 200 MG tablet Take 600 mg by mouth at bedtime as needed for mild pain.    . methocarbamol (ROBAXIN) 500 MG tablet Take 1 tablet (500 mg total) by mouth every 8 (eight) hours as needed for muscle spasms. May cause drowsiness. 15 tablet 0  . ondansetron (ZOFRAN ODT) 4 MG disintegrating tablet Take 1 tablet (4 mg total) by mouth every 8 (eight) hours as needed for nausea or vomiting. 4 tablet 0  . tamsulosin (FLOMAX) 0.4 MG CAPS capsule Take 1 capsule (0.4 mg total) by mouth daily after supper. 30 capsule 0   No current facility-administered medications on file prior to visit.     Review of Systems  Constitutional: Negative for activity change, appetite change, fatigue, fever and unexpected weight change.  HENT: Negative for congestion, rhinorrhea, sore throat and trouble swallowing.   Eyes: Negative for pain, redness, itching and visual disturbance.  Respiratory: Negative for cough, chest tightness, shortness of breath and wheezing.        Smokers cough in am and briefly when lying down occ wheezing-not now  No sob   Cardiovascular: Negative for chest pain and palpitations.  Gastrointestinal: Negative for abdominal pain, blood in stool, constipation, diarrhea and nausea.   Endocrine: Negative for cold intolerance, heat intolerance, polydipsia and polyuria.  Genitourinary: Negative for difficulty urinating, dysuria, frequency and urgency.  Musculoskeletal: Negative for arthralgias, joint swelling and myalgias.  Skin: Negative for pallor and rash.  Neurological: Negative for dizziness, tremors, weakness, numbness and headaches.  Hematological: Negative for adenopathy. Does not bruise/bleed easily.  Psychiatric/Behavioral: Negative for decreased concentration and dysphoric mood. The patient is not nervous/anxious.        Objective:   Physical  Exam Constitutional:      General: He is not in acute distress.    Appearance: Normal appearance. He is well-developed and normal weight. He is not ill-appearing or diaphoretic.  HENT:     Head: Normocephalic and atraumatic.     Right Ear: Tympanic membrane, ear canal and external ear normal.     Left Ear: Tympanic membrane, ear canal and external ear normal.     Nose: Nose normal. No congestion.     Mouth/Throat:     Mouth: Mucous membranes are moist.     Pharynx: Oropharynx is clear. No posterior oropharyngeal erythema.  Eyes:     General: No scleral icterus.       Right eye: No discharge.        Left eye: No discharge.     Conjunctiva/sclera: Conjunctivae normal.     Pupils: Pupils are equal, round, and reactive to light.  Neck:     Musculoskeletal: Normal range of motion and neck supple. No neck rigidity or muscular tenderness.     Thyroid: No thyromegaly.     Vascular: No carotid bruit or JVD.  Cardiovascular:     Rate and Rhythm: Normal rate and regular rhythm.     Pulses: Normal pulses.     Heart sounds: Normal heart sounds. No gallop.   Pulmonary:     Effort: Pulmonary effort is normal. No respiratory distress.     Breath sounds: Normal breath sounds. No wheezing or rales.     Comments: Good air exch Chest:     Chest wall: No tenderness.  Abdominal:     General: Bowel sounds are normal. There is  no distension or abdominal bruit.     Palpations: Abdomen is soft. There is no mass.     Tenderness: There is no abdominal tenderness.     Hernia: No hernia is present.  Musculoskeletal:        General: No tenderness.     Right lower leg: No edema.     Left lower leg: No edema.     Comments: No kyphosis   Lymphadenopathy:     Cervical: No cervical adenopathy.  Skin:    General: Skin is warm and dry.     Coloration: Skin is not pale.     Findings: No erythema or rash.     Comments: Tanned Much sun damage/solar aging  sks and some possible aks on arms  Solar lentigines diffusely   Neurological:     Mental Status: He is alert.     Cranial Nerves: No cranial nerve deficit.     Motor: No abnormal muscle tone.     Coordination: Coordination normal.     Gait: Gait normal.     Deep Tendon Reflexes: Reflexes are normal and symmetric. Reflexes normal.  Psychiatric:        Mood and Affect: Mood normal.        Cognition and Memory: Cognition and memory normal.           Assessment & Plan:   Problem List Items Addressed This Visit      Cardiovascular and Mediastinum   Essential hypertension    bp in fair control at this time  BP Readings from Last 1 Encounters:  08/13/19 131/70   No changes needed Most recent labs reviewed  Disc lifstyle change with low sodium diet and exercise        Relevant Medications   amLODipine (NORVASC) 10 MG tablet   rosuvastatin (CRESTOR) 5 MG  tablet   Other Relevant Orders   CBC w/Diff   Comprehensive metabolic panel   Lipid panel   TSH     Musculoskeletal and Integument   Peyronie's disease    Pt has been to urology Does not req a f/u        Genitourinary   BPH (benign prostatic hyperplasia)    occ urgency -no other c/o  No family hx  psa drawn Pt has not been to urology lately      Relevant Orders   PSA     Other   Hyperlipidemia    Disc goals for lipids and reasons to control them Rev last labs with pt Rev low sat  fat diet in detail On crestor Labs today  Does not watch his diet particularly      Relevant Medications   amLODipine (NORVASC) 10 MG tablet   rosuvastatin (CRESTOR) 5 MG tablet   Other Relevant Orders   Lipid panel   Cigarette smoker    He has almost quit in the past Intol of chantix Also nicotine patches Wants to continue to try to quit on his own  Needs another stress coping mechanism       Routine general medical examination at a health care facility - Primary    Reviewed health habits including diet and exercise and skin cancer prevention Reviewed appropriate screening tests for age  Also reviewed health mt list, fam hx and immunization status , as well as social and family history   See HPI Labs ordered pna 23 updated He continues to decline a flu shot-enc him to let us know if he re considers  (high risk smoker) Colonoscopy due 7/21-he is aware  Enc to continue to think about smoking cessation  Has lost wt likely due to decreased po intake -but need to watch this (told to weigh at home) , also given info on the [facility] lung cancer screening program to consider  Recommended derm visit or f/u for skin cancer screening and eval of poss aks on arms-he declines at this time        Family history of colon cancer    Due for colonoscopy 5 y recall 7/21 Pt is aware      History of kidney stones    Pt continues to take flomax Enc good water intake Does not want to return to urology at this time      Weight loss    Down 10 lb from over a year ago  Pt states he eats less/ no longer eats lunch (does not know why) inst to watch this and update if he looses more for no reason  He is high risk for lung cancer due to smoking-given handout on the lung cancer screening program with [facility] to consider        Other Visit Diagnoses    Need for 23-polyvalent pneumococcal polysaccharide vaccine       Relevant Orders   Pneumococcal polysaccharide vaccine 23-valent greater than or  equal to 2yo subcutaneous/IM (Completed)

## 2019-08-13 NOTE — Assessment & Plan Note (Signed)
bp in fair control at this time  BP Readings from Last 1 Encounters:  08/13/19 131/70   No changes needed Most recent labs reviewed  Disc lifstyle change with low sodium diet and exercise

## 2019-08-13 NOTE — Assessment & Plan Note (Signed)
Down 10 lb from over a year ago  Pt states he eats less/ no longer eats lunch (does not know why) inst to watch this and update if he looses more for no reason  He is high risk for lung cancer due to smoking-given handout on the lung cancer screening program with [facility] to consider

## 2019-08-13 NOTE — Assessment & Plan Note (Signed)
He has almost quit in the past Intol of chantix Also nicotine patches Wants to continue to try to quit on his own  Needs another stress coping mechanism

## 2019-08-13 NOTE — Assessment & Plan Note (Signed)
Disc goals for lipids and reasons to control them Rev last labs with pt Rev low sat fat diet in detail On crestor Labs today  Does not watch his diet particularly

## 2019-08-16 ENCOUNTER — Encounter: Payer: Self-pay | Admitting: *Deleted

## 2019-12-30 ENCOUNTER — Ambulatory Visit (INDEPENDENT_AMBULATORY_CARE_PROVIDER_SITE_OTHER)
Admission: RE | Admit: 2019-12-30 | Discharge: 2019-12-30 | Disposition: A | Discharge status: Home / Self Care | Payer: BLUE CROSS/BLUE SHIELD | Source: Ambulatory Visit | Attending: Family Medicine | Admitting: Family Medicine

## 2019-12-30 ENCOUNTER — Encounter: Payer: Self-pay | Admitting: Family Medicine

## 2019-12-30 ENCOUNTER — Ambulatory Visit: Payer: BLUE CROSS/BLUE SHIELD | Admitting: Family Medicine

## 2019-12-30 ENCOUNTER — Other Ambulatory Visit: Payer: Self-pay

## 2019-12-30 VITALS — BP 122/76 | HR 77 | Temp 98.6°F | Ht 71.0 in | Wt 188.1 lb

## 2019-12-30 DIAGNOSIS — G250 Essential tremor: Secondary | ICD-10-CM | POA: Insufficient documentation

## 2019-12-30 DIAGNOSIS — M542 Cervicalgia: Secondary | ICD-10-CM

## 2019-12-30 DIAGNOSIS — F17210 Nicotine dependence, cigarettes, uncomplicated: Secondary | ICD-10-CM

## 2019-12-30 DIAGNOSIS — R079 Chest pain, unspecified: Secondary | ICD-10-CM | POA: Diagnosis not present

## 2019-12-30 DIAGNOSIS — M4802 Spinal stenosis, cervical region: Secondary | ICD-10-CM | POA: Diagnosis not present

## 2019-12-30 DIAGNOSIS — R221 Localized swelling, mass and lump, neck: Secondary | ICD-10-CM | POA: Diagnosis not present

## 2019-12-30 NOTE — Assessment & Plan Note (Signed)
Pt is concerned about fullness over L clavicle  I cannot palpate a discreet mass but do note asymmetry from the other side (is also R handed) He is a smoker  Also having neck pain and head tremor  Referred to ENT for further eval

## 2019-12-30 NOTE — Assessment & Plan Note (Signed)
Disc in detail risks of smoking and possible outcomes including copd, vascular/ heart disease, cancer , respiratory and sinus infections  Pt voices understanding  Pt is not ready to quit  Continues 1ppd  cxr done today in light of L sided fullness over clavicle

## 2019-12-30 NOTE — Assessment & Plan Note (Signed)
This may be exacerbating neck discomfort  Per pt -this is also worsening with time  Suspect familial tremor  May consider medication or neuro w/u in the future

## 2019-12-30 NOTE — Progress Notes (Signed)
Subjective:    Patient ID: [name] [name], male    DOB: 17-Oct-1955, 64 y.o.   MRN: [id number]  This visit occurred during the SARS-CoV-2 public health emergency.  Safety protocols were in place, including screening questions prior to the visit, additional usage of staff PPE, and extensive cleaning of exam room while observing appropriate contact time as indicated for disinfecting solutions.    HPI Pt presents for neck pain  Also head tremor   Wt Readings from Last 3 Encounters:  12/30/19 188 lb 1 oz (85.3 kg)  08/13/19 186 lb 2 oz (84.4 kg)  04/27/18 196 lb 4 oz (89 kg)   26.23 kg/m    Chronic head tremor  His L neck stays painful and also swollen  Pain is sharp to burning in nature  Sometimes it radiates to collar bone area on L side   No UE symptoms at all   A little better to sleep at night (small pillow is better)  No particular position makes it worse  Heat helps some   Takes advil occ at bedtime  Not a whole lot of difference   In the mast methocarbamol did not help    Started about 3-4 weeks ago  Also thinks his head tremor is getting worse    No known family hx of tremor    Last CS films done in 2019 DG Cervical Spine 2 or 3 views (Accession [id number]) (Order TO:[id number]) Imaging Date: 11/07/2017 Department: [place] [facility] Ordering/Authorizing: Pleas Koch, NP  Exam Status  Status  Final [99]  PACS Intelerad Image Link  Show images for DG Cervical Spine 2 or 3 views  Study Result  CLINICAL DATA:  Pain left shoulder.  EXAM: CERVICAL SPINE - 2-3 VIEW  COMPARISON:  No recent prior.  FINDINGS: Degenerative changes cervical spine. Degenerative changes most prominent at C4-C5 and C5-C6. No evidence of fracture or dislocation. No acute abnormality identified. Pulmonary apices are clear.  IMPRESSION: Degenerative changes cervical spine. Degenerative changes most prominent C4-C5 and C5-C6. No acute abnormality  identified.   Electronically Signed   By: Marcello Moores  Register   On: 11/07/2017 09:29   No rash  He notes swelling on L side of neck from front to the side   More issues with short term memory lately   His past L arm and shoulder pain has improved   Lab Results  Component Value Date   TSH 1.16 08/13/2019    No issues with gout lately   Patient Active Problem List   Diagnosis Date Noted  . Neck pain on left side 12/30/2019  . Benign head tremor 12/30/2019  . Localized swelling, mass or lump of neck 12/30/2019  . Peyronie's disease 08/13/2019  . Weight loss 08/13/2019  . History of kidney stones 04/27/2018  . Chronic left shoulder pain 11/07/2017  . Cough 01/30/2015  . Essential hypertension 01/30/2015  . Family history of colon cancer 01/30/2015  . Routine general medical examination at a health care facility 05/18/2012  . Prostate cancer screening 05/18/2012  . LATERAL EPICONDYLITIS 04/26/2010  . BACK PAIN, LUMBAR 07/07/2008  . NONSPECIFIC MESENTERIC LYMPHADENITIS 06/08/2008  . COLONIC POLYPS 01/27/2008  . Cigarette smoker 01/27/2008  . FOOT PAIN, LEFT 01/27/2008  . SYNCOPE 01/27/2008  . FATIGUE 01/27/2008  . Hyperlipidemia 01/26/2008  . GOUT 01/26/2008  . GERD 01/26/2008  . BPH (benign prostatic hyperplasia) 01/26/2008   Past Medical History:  Diagnosis Date  . Benign prostatic hypertrophy   .  GERD (gastroesophageal reflux disease)   . Gout   . Hyperlipidemia    no meds  . Hypertension    Past Surgical History:  Procedure Laterality Date  . APPENDECTOMY    . CARPAL TUNNEL RELEASE    . CHOLECYSTECTOMY    . COLONOSCOPY  04/2001 6/09   polyps  . liver hemangioma  9/09   Social History   Tobacco Use  . Smoking status: Current Every Day Smoker    Packs/day: 1.00    Years: 44.00    Pack years: 44.00    Types: Cigarettes  . Smokeless tobacco: Never Used  Substance Use Topics  . Alcohol use: No    Alcohol/week: 0.0 standard drinks  . Drug use: No    Family History  Problem Relation Age of Onset  . Cancer Father   . Colon cancer Father   . Heart disease Brother   . Esophageal cancer Neg Hx   . Stomach cancer Neg Hx   . Rectal cancer Neg Hx    Allergies  Allergen Reactions  . Chantix [Varenicline Tartrate]     Insomnia   . Lipitor [Atorvastatin]     Leg pain  . Watermelon [Citrullus Vulgaris]     Thinks it gave  Him hives   Current Outpatient Medications on File Prior to Visit  Medication Sig Dispense Refill  . amLODipine (NORVASC) 10 MG tablet Take 1 tablet (10 mg total) by mouth daily. 90 tablet 3  . ibuprofen (ADVIL,MOTRIN) 200 MG tablet Take 600 mg by mouth at bedtime as needed for mild pain.    . methocarbamol (ROBAXIN) 500 MG tablet Take 1 tablet (500 mg total) by mouth every 8 (eight) hours as needed for muscle spasms. May cause drowsiness. 15 tablet 0  . ondansetron (ZOFRAN ODT) 4 MG disintegrating tablet Take 1 tablet (4 mg total) by mouth every 8 (eight) hours as needed for nausea or vomiting. 4 tablet 0  . rosuvastatin (CRESTOR) 5 MG tablet Take 1 tablet (5 mg total) by mouth daily. 90 tablet 3  . tamsulosin (FLOMAX) 0.4 MG CAPS capsule Take 1 capsule (0.4 mg total) by mouth daily after supper. 30 capsule 0   No current facility-administered medications on file prior to visit.    Review of Systems  Constitutional: Negative for activity change, appetite change, fatigue, fever and unexpected weight change.  HENT: Negative for congestion, rhinorrhea, sore throat and trouble swallowing.        Pt feels fullness over L clavicle and discomfort in neck  Eyes: Negative for pain, redness, itching and visual disturbance.  Respiratory: Negative for cough, chest tightness, shortness of breath and wheezing.        Occ smokers cough No sog  Cardiovascular: Negative for chest pain and palpitations.  Gastrointestinal: Negative for abdominal pain, blood in stool, constipation, diarrhea and nausea.  Endocrine: Negative for cold  intolerance, heat intolerance, polydipsia and polyuria.  Genitourinary: Negative for difficulty urinating, dysuria, frequency and urgency.  Musculoskeletal: Positive for neck pain. Negative for arthralgias, joint swelling, myalgias and neck stiffness.       Crepitus and discomfort in neck  Skin: Negative for pallor and rash.  Neurological: Negative for dizziness, tremors, weakness, numbness and headaches.  Hematological: Negative for adenopathy. Does not bruise/bleed easily.  Psychiatric/Behavioral: Negative for decreased concentration and dysphoric mood. The patient is not nervous/anxious.        Extremely high stress level with family issues that cannot be resolved       Objective:  Physical Exam Constitutional:      General: He is not in acute distress.    Appearance: Normal appearance. He is normal weight. He is not ill-appearing or diaphoretic.  HENT:     Head: Normocephalic and atraumatic.     Comments: Head tremor noted at rest Eyes:     General: No scleral icterus.    Extraocular Movements: Extraocular movements intact.     Conjunctiva/sclera: Conjunctivae normal.     Pupils: Pupils are equal, round, and reactive to light.  Neck:     Vascular: No carotid bruit.     Comments: No bony tenderness  Some tenderness of L cervical musculature as well as SCM muscle  Pain is worse with L sided head tilt   Tissue over L scapula is more prominent on L vs R  No discreet mass or LN palpated   Cardiovascular:     Rate and Rhythm: Normal rate and regular rhythm.     Heart sounds: Normal heart sounds.  Pulmonary:     Effort: Pulmonary effort is normal. No respiratory distress.     Breath sounds: No wheezing or rales.     Comments: bs are mildly distant  No wheeze Musculoskeletal:     Cervical back: Normal range of motion and neck supple. Tenderness present. No rigidity.     Right lower leg: No edema.     Left lower leg: No edema.  Lymphadenopathy:     Cervical: No cervical  adenopathy.  Skin:    General: Skin is warm and dry.     Coloration: Skin is not pale.     Findings: No erythema or rash.     Comments: tanned  Neurological:     Mental Status: He is alert.     Cranial Nerves: No cranial nerve deficit or facial asymmetry.     Sensory: No sensory deficit.     Motor: Tremor present. No weakness or atrophy.     Coordination: Coordination is intact. Coordination normal.     Deep Tendon Reflexes: Reflexes normal.     Comments: Head tremor  No hand tremor witnessed today           Assessment & Plan:   Problem List Items Addressed This Visit      Nervous and Auditory   Benign head tremor    This may be exacerbating neck discomfort  Per pt -this is also worsening with time  Suspect familial tremor  May consider medication or neuro w/u in the future        Other   Cigarette smoker    Disc in detail risks of smoking and possible outcomes including copd, vascular/ heart disease, cancer , respiratory and sinus infections  Pt voices understanding  Pt is not ready to quit  Continues 1ppd  cxr done today in light of L sided fullness over clavicle      Relevant Orders   DG Chest 2 View (Completed)   Neck pain on left side - Primary    Pain radiates to L lateral and anterior neck as well as L clavicular area (burning in nature)  I suspect some degree of radiculopathy with known CS degenerative disease CS xray done today as well as chest film  Pt declines treatment at this time as it is not severe, but concerned about it       Relevant Orders   DG Cervical Spine Complete (Completed)   DG Chest 2 View (Completed)   Localized swelling, mass or lump  of neck    Pt is concerned about fullness over L clavicle  I cannot palpate a discreet mass but do note asymmetry from the other side (is also R handed) He is a smoker  Also having neck pain and head tremor  Referred to ENT for further eval       Relevant Orders   Ambulatory referral to ENT

## 2019-12-30 NOTE — Assessment & Plan Note (Signed)
Pain radiates to L lateral and anterior neck as well as L clavicular area (burning in nature)  I suspect some degree of radiculopathy with known CS degenerative disease CS xray done today as well as chest film  Pt declines treatment at this time as it is not severe, but concerned about it

## 2019-12-30 NOTE — Patient Instructions (Signed)
Try ice as well as heat to painful area   I placed a specialist (ENT) referral to check out the neck area fullness  The office will call you to set that up   Xray of cervical spine and lungs today - we will call you with a result   Think about quitting smoking when you are ready

## 2020-01-05 ENCOUNTER — Ambulatory Visit (INDEPENDENT_AMBULATORY_CARE_PROVIDER_SITE_OTHER): Payer: BLUE CROSS/BLUE SHIELD | Admitting: Otolaryngology

## 2020-01-05 ENCOUNTER — Other Ambulatory Visit: Payer: Self-pay

## 2020-01-05 ENCOUNTER — Encounter (INDEPENDENT_AMBULATORY_CARE_PROVIDER_SITE_OTHER): Payer: Self-pay | Admitting: Otolaryngology

## 2020-01-05 VITALS — Temp 97.7°F

## 2020-01-05 DIAGNOSIS — M542 Cervicalgia: Secondary | ICD-10-CM | POA: Diagnosis not present

## 2020-01-05 NOTE — Progress Notes (Signed)
HPI: [name] [name] is a 64 y.o. male who presents is referred by Dr. Glori Bickers for evaluation of left neck pain and complaints of intermittent swelling in the left neck.  This is been going on for a little more than a month.  He has taken ibuprofen which seems to help a little bit.  He denies any sore throat no hoarseness.  He does smoke 1 pack/day. He has had a chronic tremor over the past year. A year ago he had pain in the left neck that extended down his shoulder and left arm.  Sometimes when he slept he woke up with decreased mobility of the arm but this recovered without any intervention..  Past Medical History:  Diagnosis Date  . Benign prostatic hypertrophy   . GERD (gastroesophageal reflux disease)   . Gout   . Hyperlipidemia    no meds  . Hypertension    Past Surgical History:  Procedure Laterality Date  . APPENDECTOMY    . CARPAL TUNNEL RELEASE    . CHOLECYSTECTOMY    . COLONOSCOPY  04/2001 6/09   polyps  . liver hemangioma  9/09   Social History   Socioeconomic History  . Marital status: Divorced    Spouse name: Not on file  . Number of children: Not on file  . Years of education: Not on file  . Highest education level: Not on file  Occupational History  . Occupation: Septic tank service   Tobacco Use  . Smoking status: Current Every Day Smoker    Packs/day: 1.00    Years: 51.00    Pack years: 51.00    Types: Cigarettes    Start date: 7  . Smokeless tobacco: Never Used  Substance and Sexual Activity  . Alcohol use: No    Alcohol/week: 0.0 standard drinks  . Drug use: No  . Sexual activity: Not on file  Other Topics Concern  . Not on file  Social History Narrative   Divorced, 2 children, works as a Administrator.   Social Determinants of Health   Financial Resource Strain:   . Difficulty of Paying Living Expenses:   Food Insecurity:   . Worried About Charity fundraiser in the Last Year:   . Arboriculturist in the Last Year:   Transportation  Needs:   . Film/video editor (Medical):   Marland Kitchen Lack of Transportation (Non-Medical):   Physical Activity:   . Days of Exercise per Week:   . Minutes of Exercise per Session:   Stress:   . Feeling of Stress :   Social Connections:   . Frequency of Communication with Friends and Family:   . Frequency of Social Gatherings with Friends and Family:   . Attends Religious Services:   . Active Member of Clubs or Organizations:   . Attends Archivist Meetings:   Marland Kitchen Marital Status:    Family History  Problem Relation Age of Onset  . Cancer Father   . Colon cancer Father   . Heart disease Brother   . Esophageal cancer Neg Hx   . Stomach cancer Neg Hx   . Rectal cancer Neg Hx    Allergies  Allergen Reactions  . Chantix [Varenicline Tartrate]     Insomnia   . Lipitor [Atorvastatin]     Leg pain  . Watermelon [Citrullus Vulgaris]     Thinks it gave  Him hives   Prior to Admission medications   Medication Sig Start Date End Date  Taking? Authorizing Provider  amLODipine (NORVASC) 10 MG tablet Take 1 tablet (10 mg total) by mouth daily. 08/13/19  Yes Tower, Wynelle Fanny, MD  ibuprofen (ADVIL,MOTRIN) 200 MG tablet Take 600 mg by mouth at bedtime as needed for mild pain.   Yes [provider]  methocarbamol (ROBAXIN) 500 MG tablet Take 1 tablet (500 mg total) by mouth every 8 (eight) hours as needed for muscle spasms. May cause drowsiness. 11/07/17  Yes Pleas Koch, NP  ondansetron (ZOFRAN ODT) 4 MG disintegrating tablet Take 1 tablet (4 mg total) by mouth every 8 (eight) hours as needed for nausea or vomiting. 03/27/18  Yes Khatri, Hina, PA-C  rosuvastatin (CRESTOR) 5 MG tablet Take 1 tablet (5 mg total) by mouth daily. 08/13/19  Yes Tower, Wynelle Fanny, MD  tamsulosin (FLOMAX) 0.4 MG CAPS capsule Take 1 capsule (0.4 mg total) by mouth daily after supper. 03/27/18  Yes Khatri, Hina, PA-C     Positive ROS: Otherwise negative  All other systems have been reviewed and were  otherwise negative with the exception of those mentioned in the HPI and as above.  Physical Exam: Constitutional: Alert, well-appearing, no acute distress Ears: External ears without lesions or tenderness. Ear canals are clear bilaterally with intact, clear TMs.  Nasal: External nose without lesions. Septum deviated to the left with mild rhinitis.. Clear nasal passages with no signs of infection. Oral: Lips and gums without lesions. Tongue and palate mucosa without lesions. Posterior oropharynx clear.  He is status post tonsillectomy.  He has prominent torus mandibularis bilaterally.  Mucosal membranes are clear. Indirect laryngoscopy revealed a clear base of tongue and hypopharynx. Fiberoptic laryngoscopy through the right nostril revealed a clear nasopharynx.  Epiglottis and vallecula were clear.  Vocal cords were clear bilaterally with normal vocal cord mobility.  Piriform sinuses were clear. Neck: On palpation of the neck he has no palpable masses or significant swelling in the left neck.  No adenopathy.  No erythema. Respiratory: Breathing comfortably  Skin: No facial/neck lesions or rash noted.  Laryngoscopy  Date/Time: 01/05/2020 9:45 AM Performed by: Rozetta Nunnery, MD Authorized by: Rozetta Nunnery, MD   Consent:    Consent obtained:  Verbal   Consent given by:  Patient Procedure details:    Indications: direct visualization of the upper aerodigestive tract     Instrument: flexible fiberoptic laryngoscope     Scope location: right nare   Sinus:    Right nasopharynx: normal     Left nasopharynx: normal   Mouth:    Oropharynx: normal     Vallecula: normal     Base of tongue: normal     Epiglottis: normal   Throat:    True vocal cords: normal   Comments:     Upper airway was normal to examination on fiberoptic laryngoscopy with no evidence of infection or tumor.    Assessment: Left neck pain most likely is related to musculoskeletal problems in the neck.   There is no clinical signs of infection.  I do not appreciate any abnormal adenopathy in the neck and upper airway is clear to examination.  Plan: Would recommend further evaluation with orthopedist if pain persist to rule out cervical spine problems. In the meantime recommended warm compresses and use of NSAIDs.   Radene Journey, MD   CC:

## 2020-09-05 ENCOUNTER — Other Ambulatory Visit: Payer: Self-pay | Admitting: Family Medicine

## 2020-09-06 NOTE — Telephone Encounter (Signed)
Please schedule a winter PE and refill until then

## 2020-09-06 NOTE — Telephone Encounter (Signed)
Pt's had one acute appt this year but no recent or future f/u or CPE appts., please advise

## 2020-09-06 NOTE — Telephone Encounter (Signed)
Med refilled once and Carrie will reach out to pt to try and get appt scheduled  

## 2020-09-07 NOTE — Telephone Encounter (Signed)
I spoke to patient.  Patient said he would call back to schedule cpx.

## 2020-11-06 ENCOUNTER — Telehealth: Payer: Self-pay | Admitting: Family Medicine

## 2020-11-06 DIAGNOSIS — Z125 Encounter for screening for malignant neoplasm of prostate: Secondary | ICD-10-CM

## 2020-11-06 DIAGNOSIS — N401 Enlarged prostate with lower urinary tract symptoms: Secondary | ICD-10-CM

## 2020-11-06 DIAGNOSIS — I10 Essential (primary) hypertension: Secondary | ICD-10-CM

## 2020-11-06 DIAGNOSIS — E7800 Pure hypercholesterolemia, unspecified: Secondary | ICD-10-CM

## 2020-11-06 NOTE — Telephone Encounter (Signed)
-----   Message from Cloyd Stagers, RT sent at 10/25/2020  2:13 PM EST ----- Regarding: Lab Orders for Thursday 2.17.2022 Please place lab orders for Thursday 2.17.2022, office visit for physical on Thursday 2.24.2022 Thank you, Dyke Maes RT(R)

## 2020-11-09 ENCOUNTER — Other Ambulatory Visit: Payer: Self-pay

## 2020-11-09 ENCOUNTER — Other Ambulatory Visit (INDEPENDENT_AMBULATORY_CARE_PROVIDER_SITE_OTHER): Payer: BLUE CROSS/BLUE SHIELD

## 2020-11-09 DIAGNOSIS — Z125 Encounter for screening for malignant neoplasm of prostate: Secondary | ICD-10-CM

## 2020-11-09 DIAGNOSIS — N401 Enlarged prostate with lower urinary tract symptoms: Secondary | ICD-10-CM

## 2020-11-09 DIAGNOSIS — E7800 Pure hypercholesterolemia, unspecified: Secondary | ICD-10-CM

## 2020-11-09 DIAGNOSIS — I10 Essential (primary) hypertension: Secondary | ICD-10-CM | POA: Diagnosis not present

## 2020-11-09 LAB — LIPID PANEL
Cholesterol: 140 mg/dL (ref 0–200)
HDL: 27 mg/dL — ABNORMAL LOW (ref >39.00)
LDL Cholesterol: 78 mg/dL (ref 0–99)
NonHDL: 113.38
Total CHOL/HDL Ratio: 5
Triglycerides: 177 mg/dL — ABNORMAL HIGH (ref 0.0–149.0)
VLDL: 35.4 mg/dL (ref 0.0–40.0)

## 2020-11-09 LAB — TSH: TSH: 1.61 u[IU]/mL (ref 0.35–4.50)

## 2020-11-09 LAB — CBC WITH DIFFERENTIAL/PLATELET
Basophils Absolute: 0.1 10*3/uL (ref 0.0–0.1)
Basophils Relative: 0.8 % (ref 0.0–3.0)
Eosinophils Absolute: 0.2 10*3/uL (ref 0.0–0.7)
Eosinophils Relative: 2.2 % (ref 0.0–5.0)
HCT: 43.8 % (ref 39.0–52.0)
Hemoglobin: 15.1 g/dL (ref 13.0–17.0)
Lymphocytes Relative: 26.6 % (ref 12.0–46.0)
Lymphs Abs: 2.3 10*3/uL (ref 0.7–4.0)
MCHC: 34.4 g/dL (ref 30.0–36.0)
MCV: 91.6 fl (ref 78.0–100.0)
Monocytes Absolute: 0.5 10*3/uL (ref 0.1–1.0)
Monocytes Relative: 6.1 % (ref 3.0–12.0)
Neutro Abs: 5.4 10*3/uL (ref 1.4–7.7)
Neutrophils Relative %: 64.3 % (ref 43.0–77.0)
Platelets: 288 10*3/uL (ref 150.0–400.0)
RBC: 4.79 Mil/uL (ref 4.22–5.81)
RDW: 13.4 % (ref 11.5–15.5)
WBC: 8.5 10*3/uL (ref 4.0–10.5)

## 2020-11-09 LAB — COMPREHENSIVE METABOLIC PANEL
ALT: 14 U/L (ref 0–53)
AST: 13 U/L (ref 0–37)
Albumin: 3.9 g/dL (ref 3.5–5.2)
Alkaline Phosphatase: 82 U/L (ref 39–117)
BUN: 14 mg/dL (ref 6–23)
CO2: 27 mEq/L (ref 19–32)
Calcium: 8.7 mg/dL (ref 8.4–10.5)
Chloride: 109 mEq/L (ref 96–112)
Creatinine, Ser: 0.9 mg/dL (ref 0.40–1.50)
GFR: 90.13 mL/min (ref >60.00)
Glucose, Bld: 107 mg/dL — ABNORMAL HIGH (ref 70–99)
Potassium: 4.3 mEq/L (ref 3.5–5.1)
Sodium: 140 mEq/L (ref 135–145)
Total Bilirubin: 0.3 mg/dL (ref 0.2–1.2)
Total Protein: 6.4 g/dL (ref 6.0–8.3)

## 2020-11-09 LAB — PSA: PSA: 1.41 ng/mL (ref 0.10–4.00)

## 2020-11-16 ENCOUNTER — Ambulatory Visit (INDEPENDENT_AMBULATORY_CARE_PROVIDER_SITE_OTHER): Payer: BLUE CROSS/BLUE SHIELD | Admitting: Family Medicine

## 2020-11-16 ENCOUNTER — Other Ambulatory Visit: Payer: Self-pay

## 2020-11-16 ENCOUNTER — Encounter: Payer: Self-pay | Admitting: Family Medicine

## 2020-11-16 VITALS — BP 128/74 | HR 80 | Temp 97.0°F | Ht 71.25 in | Wt 182.0 lb

## 2020-11-16 DIAGNOSIS — Z0000 Encounter for general adult medical examination without abnormal findings: Secondary | ICD-10-CM

## 2020-11-16 DIAGNOSIS — E7800 Pure hypercholesterolemia, unspecified: Secondary | ICD-10-CM

## 2020-11-16 DIAGNOSIS — N401 Enlarged prostate with lower urinary tract symptoms: Secondary | ICD-10-CM

## 2020-11-16 DIAGNOSIS — F17210 Nicotine dependence, cigarettes, uncomplicated: Secondary | ICD-10-CM

## 2020-11-16 DIAGNOSIS — R634 Abnormal weight loss: Secondary | ICD-10-CM

## 2020-11-16 DIAGNOSIS — I10 Essential (primary) hypertension: Secondary | ICD-10-CM

## 2020-11-16 DIAGNOSIS — Z125 Encounter for screening for malignant neoplasm of prostate: Secondary | ICD-10-CM

## 2020-11-16 DIAGNOSIS — L570 Actinic keratosis: Secondary | ICD-10-CM | POA: Insufficient documentation

## 2020-11-16 MED ORDER — TAMSULOSIN HCL 0.4 MG PO CAPS: 0.4000 mg | ORAL_CAPSULE | Freq: Every day | ORAL | 3 refills | Status: DC

## 2020-11-16 MED ORDER — AMLODIPINE BESYLATE 10 MG PO TABS
10.0000 mg | ORAL_TABLET | Freq: Every day | ORAL | 3 refills | Status: DC
Start: 2020-11-16 — End: 2021-12-11

## 2020-11-16 MED ORDER — ROSUVASTATIN CALCIUM 5 MG PO TABS: 5.0000 mg | ORAL_TABLET | Freq: Every day | ORAL | 3 refills | Status: DC

## 2020-11-16 NOTE — Assessment & Plan Note (Signed)
Improved Disc goals for lipids and reasons to control them Rev last labs with pt Rev low sat fat diet in detail Plan to continue crestor 5 mg daily

## 2020-11-16 NOTE — Assessment & Plan Note (Signed)
Appetite continues to decrease  Enc more regular meals  Ref made to pulmonary, pt is more motivasted to quit smoking

## 2020-11-16 NOTE — Assessment & Plan Note (Signed)
Disc in detail risks of smoking and possible outcomes including copd, vascular/ heart disease, cancer , respiratory and sinus infections  Pt voices understanding Pt is more motivated to quit Plans to look into hypnosis programs Also ref to pulmonary for symptoms of copd -more sob with exertion and cough

## 2020-11-16 NOTE — Assessment & Plan Note (Signed)
Multiple on arms with solar damage  Disc use of sunscreen  Ref made to dermatology for eval and tx

## 2020-11-16 NOTE — Assessment & Plan Note (Signed)
Disc trial of flomax daily (instead of prn) for urinary frequency inst to call if side effects or problems  Lab Results  Component Value Date   PSA 1.41 11/09/2020   PSA 1.08 08/13/2019   PSA 1.02 04/27/2018    Consider urology f/u if not improved

## 2020-11-16 NOTE — Assessment & Plan Note (Signed)
bp in fair control at this time  BP Readings from Last 1 Encounters:  11/16/20 128/74   No changes needed Most recent labs reviewed  Disc lifstyle change with low sodium diet and exercise  Plan to continue amlodipine 10 mg daily

## 2020-11-16 NOTE — Assessment & Plan Note (Signed)
Reviewed health habits including diet and exercise and skin cancer prevention Reviewed appropriate screening tests for age  Also reviewed health mt list, fam hx and immunization status , as well as social and family history   See HPI Labs reviewed  covid immunized  Recommend shingrix pulm ref done for smoking/breathing changes To derm for SK and skin cancer screen  Colonoscopy 7/16-overdue for 5 y recall (family hx) Pt not ready to schedule quite yet but plans to and will call if referral is needed (has reminder letter) psa reviewed/some BPH symptoms

## 2020-11-16 NOTE — Progress Notes (Signed)
Subjective:    Patient ID: [name] [name], male    DOB: 1956-01-11, 65 y.o.   MRN: [id number]  This visit occurred during the SARS-CoV-2 public health emergency.  Safety protocols were in place, including screening questions prior to the visit, additional usage of staff PPE, and extensive cleaning of exam room while observing appropriate contact time as indicated for disinfecting solutions.    HPI  Here for health maintenance exam and to review chronic medical problems    Wt Readings from Last 3 Encounters:  11/16/20 182 lb (82.6 kg)  12/30/19 188 lb 1 oz (85.3 kg)  08/13/19 186 lb 2 oz (84.4 kg)   25.21 kg/m   Working  Not doing much else  Feeling pretty good    covid immunized  Not going to get a covid booster (he got hives with the 2nd shot)  He knows too many people with problems with the booster   Tdap 5/16 Flu shot -declines Zoster status -shingrix discussed  Smoking status  More cough (whey lying down)  More comfortable sleeping on R side  More short of breath and it bothers him  Interested in hypnosis for treatment    Colonoscopy 7/16 with 5 y recall  Father had colon cancer    Prostate health H/o BPH  No nocturia  Lab Results  Component Value Date   PSA 1.41 11/09/2020   PSA 1.08 08/13/2019   PSA 1.02 04/27/2018   Last kidney stone a year ago  No recent urology appt Has flomax if needed  More urgency of urination   Some dry areas on arms/? Sun damage Would like to see dermatology     HTN bp is stable today  No cp or palpitations or headaches or edema  No side effects to medicines  BP Readings from Last 3 Encounters:  11/16/20 128/74  12/30/19 122/76  08/13/19 131/70     Take amlodipine 10 mg daily    Hyperlipidemia Lab Results  Component Value Date   CHOL 140 11/09/2020   CHOL 148 08/13/2019   CHOL 141 04/27/2018   Lab Results  Component Value Date   HDL 27.00 (L) 11/09/2020   HDL 28.30 (L) 08/13/2019   HDL 26.90 (L)  04/27/2018   Lab Results  Component Value Date   LDLCALC 78 11/09/2020   LDLCALC 102 (H) 08/13/2019   LDLCALC 83 04/27/2018   Lab Results  Component Value Date   TRIG 177.0 (H) 11/09/2020   TRIG 86.0 08/13/2019   TRIG 158.0 (H) 04/27/2018   Lab Results  Component Value Date   CHOLHDL 5 11/09/2020   CHOLHDL 5 08/13/2019   CHOLHDL 5 04/27/2018   Lab Results  Component Value Date   LDLDIRECT 128.0 03/01/2016   LDLDIRECT 133.0 01/30/2015   LDLDIRECT 93.3 04/26/2010   Taking crestor 5 mg daily  Does not have a good diet  Skips meals  Fatty food -not a lot and not a lot of beef  Eats soups and sandwiches    HDL is baseline low  No additional exercise   Patient Active Problem List   Diagnosis Date Noted  . AK (actinic keratosis) 11/16/2020  . Neck pain on left side 12/30/2019  . Benign head tremor 12/30/2019  . Localized swelling, mass or lump of neck 12/30/2019  . Peyronie's disease 08/13/2019  . Weight loss 08/13/2019  . History of kidney stones 04/27/2018  . Chronic left shoulder pain 11/07/2017  . Cough 01/30/2015  . Essential hypertension 01/30/2015  .  Family history of colon cancer 01/30/2015  . Routine general medical examination at a health care facility 05/18/2012  . Prostate cancer screening 05/18/2012  . LATERAL EPICONDYLITIS 04/26/2010  . BACK PAIN, LUMBAR 07/07/2008  . NONSPECIFIC MESENTERIC LYMPHADENITIS 06/08/2008  . COLONIC POLYPS 01/27/2008  . Cigarette smoker 01/27/2008  . FOOT PAIN, LEFT 01/27/2008  . SYNCOPE 01/27/2008  . FATIGUE 01/27/2008  . Hyperlipidemia 01/26/2008  . GOUT 01/26/2008  . GERD 01/26/2008  . BPH (benign prostatic hyperplasia) 01/26/2008   Past Medical History:  Diagnosis Date  . Benign prostatic hypertrophy   . GERD (gastroesophageal reflux disease)   . Gout   . Hyperlipidemia    no meds  . Hypertension    Past Surgical History:  Procedure Laterality Date  . APPENDECTOMY    . CARPAL TUNNEL RELEASE    .  CHOLECYSTECTOMY    . COLONOSCOPY  04/2001 6/09   polyps  . liver hemangioma  9/09   Social History   Tobacco Use  . Smoking status: Current Every Day Smoker    Packs/day: 1.00    Years: 51.00    Pack years: 51.00    Types: Cigarettes    Start date: 58  . Smokeless tobacco: Never Used  Substance Use Topics  . Alcohol use: No    Alcohol/week: 0.0 standard drinks  . Drug use: No   Family History  Problem Relation Age of Onset  . Cancer Father   . Colon cancer Father   . Heart disease Brother   . Esophageal cancer Neg Hx   . Stomach cancer Neg Hx   . Rectal cancer Neg Hx    Allergies  Allergen Reactions  . Chantix [Varenicline Tartrate]     Insomnia   . Lipitor [Atorvastatin]     Leg pain  . Watermelon [Citrullus Vulgaris]     Thinks it gave  Him hives   Current Outpatient Medications on File Prior to Visit  Medication Sig Dispense Refill  . ibuprofen (ADVIL,MOTRIN) 200 MG tablet Take 600 mg by mouth at bedtime as needed for mild pain.     No current facility-administered medications on file prior to visit.    Review of Systems  Constitutional: Positive for fatigue. Negative for activity change, appetite change, fever and unexpected weight change.  HENT: Negative for congestion, rhinorrhea, sore throat and trouble swallowing.   Eyes: Negative for pain, redness, itching and visual disturbance.  Respiratory: Positive for cough and shortness of breath. Negative for chest tightness and wheezing.   Cardiovascular: Negative for chest pain and palpitations.  Gastrointestinal: Negative for abdominal pain, blood in stool, constipation, diarrhea and nausea.  Endocrine: Negative for cold intolerance, heat intolerance, polydipsia and polyuria.  Genitourinary: Positive for frequency and urgency. Negative for difficulty urinating and dysuria.  Musculoskeletal: Negative for arthralgias, joint swelling and myalgias.  Skin: Negative for pallor and rash.  Neurological: Negative for  dizziness, tremors, weakness, numbness and headaches.  Hematological: Negative for adenopathy. Does not bruise/bleed easily.  Psychiatric/Behavioral: Negative for decreased concentration and dysphoric mood. The patient is not nervous/anxious.        Objective:   Physical Exam Constitutional:      General: He is not in acute distress.    Appearance: Normal appearance. He is well-developed and normal weight. He is not ill-appearing or diaphoretic.  HENT:     Head: Normocephalic and atraumatic.     Right Ear: Tympanic membrane, ear canal and external ear normal.     Left Ear: Tympanic membrane,  ear canal and external ear normal.     Nose: Nose normal. No congestion.     Mouth/Throat:     Mouth: Mucous membranes are moist.     Pharynx: Oropharynx is clear. No posterior oropharyngeal erythema.  Eyes:     General: No scleral icterus.       Right eye: No discharge.        Left eye: No discharge.     Conjunctiva/sclera: Conjunctivae normal.     Pupils: Pupils are equal, round, and reactive to light.  Neck:     Thyroid: No thyromegaly.     Vascular: No carotid bruit or JVD.  Cardiovascular:     Rate and Rhythm: Normal rate and regular rhythm.     Pulses: Normal pulses.     Heart sounds: Normal heart sounds. No gallop.   Pulmonary:     Effort: Pulmonary effort is normal. No respiratory distress.     Breath sounds: Normal breath sounds. No wheezing or rales.     Comments: Diffusely distant bs  Chest:     Chest wall: No tenderness.  Abdominal:     General: Bowel sounds are normal. There is no distension or abdominal bruit.     Palpations: Abdomen is soft. There is no mass.     Tenderness: There is no abdominal tenderness.     Hernia: No hernia is present.  Musculoskeletal:        General: No tenderness.     Cervical back: Normal range of motion and neck supple. No rigidity. No muscular tenderness.     Right lower leg: No edema.     Left lower leg: No edema.     Comments: No  kyphosis   Lymphadenopathy:     Cervical: No cervical adenopathy.  Skin:    General: Skin is warm and dry.     Coloration: Skin is not pale.     Findings: No erythema or rash.     Comments: Multiple AKs and scabs on dorsal hands and forearms  Solar damage and aging Solar lentigines diffusely    Neurological:     Mental Status: He is alert.     Cranial Nerves: No cranial nerve deficit.     Motor: No abnormal muscle tone.     Coordination: Coordination normal.     Gait: Gait normal.     Deep Tendon Reflexes: Reflexes are normal and symmetric. Reflexes normal.  Psychiatric:        Mood and Affect: Mood normal.        Cognition and Memory: Cognition and memory normal.           Assessment & Plan:   Problem List Items Addressed This Visit      Cardiovascular and Mediastinum   Essential hypertension    bp in fair control at this time  BP Readings from Last 1 Encounters:  11/16/20 128/74   No changes needed Most recent labs reviewed  Disc lifstyle change with low sodium diet and exercise  Plan to continue amlodipine 10 mg daily      Relevant Medications   amLODipine (NORVASC) 10 MG tablet   rosuvastatin (CRESTOR) 5 MG tablet     Musculoskeletal and Integument   AK (actinic keratosis)    Multiple on arms with solar damage  Disc use of sunscreen  Ref made to dermatology for eval and tx      Relevant Orders   Ambulatory referral to Dermatology     Genitourinary  BPH (benign prostatic hyperplasia)    Disc trial of flomax daily (instead of prn) for urinary frequency inst to call if side effects or problems  Lab Results  Component Value Date   PSA 1.41 11/09/2020   PSA 1.08 08/13/2019   PSA 1.02 04/27/2018    Consider urology f/u if not improved       Relevant Medications   tamsulosin (FLOMAX) 0.4 MG CAPS capsule     Other   Hyperlipidemia    Improved Disc goals for lipids and reasons to control them Rev last labs with pt Rev low sat fat diet in  detail Plan to continue crestor 5 mg daily        Relevant Medications   amLODipine (NORVASC) 10 MG tablet   rosuvastatin (CRESTOR) 5 MG tablet   Cigarette smoker    Disc in detail risks of smoking and possible outcomes including copd, vascular/ heart disease, cancer , respiratory and sinus infections  Pt voices understanding Pt is more motivated to quit Plans to look into hypnosis programs Also ref to pulmonary for symptoms of copd -more sob with exertion and cough      Relevant Orders   Ambulatory referral to Pulmonology   Routine general medical examination at a health care facility - Primary    Reviewed health habits including diet and exercise and skin cancer prevention Reviewed appropriate screening tests for age  Also reviewed health mt list, fam hx and immunization status , as well as social and family history   See HPI Labs reviewed  covid immunized  Recommend shingrix pulm ref done for smoking/breathing changes To derm for SK and skin cancer screen  Colonoscopy 7/16-overdue for 5 y recall (family hx) Pt not ready to schedule quite yet but plans to and will call if referral is needed (has reminder letter) psa reviewed/some BPH symptoms       Prostate cancer screening    More BPH symptoms  Lab Results  Component Value Date   PSA 1.41 11/09/2020   PSA 1.08 08/13/2019   PSA 1.02 04/27/2018          Weight loss    Appetite continues to decrease  Enc more regular meals  Ref made to pulmonary, pt is more motivasted to quit smoking

## 2020-11-16 NOTE — Patient Instructions (Addendum)
Look into hypnosis in the area for smoking cessation   I placed a referral to pulmonary  You will get a call   You are due for a colonoscopy  Schedule that when you are ready or let us know and we can start the process   Try the generic flomax daily to see if it helps with frequent /urgent  urination (and flow)   I sent a referral to dermatology

## 2020-11-16 NOTE — Assessment & Plan Note (Signed)
More BPH symptoms  Lab Results  Component Value Date   PSA 1.41 11/09/2020   PSA 1.08 08/13/2019   PSA 1.02 04/27/2018

## 2020-12-13 DIAGNOSIS — L570 Actinic keratosis: Secondary | ICD-10-CM | POA: Diagnosis not present

## 2020-12-13 DIAGNOSIS — L814 Other melanin hyperpigmentation: Secondary | ICD-10-CM | POA: Diagnosis not present

## 2020-12-13 DIAGNOSIS — L812 Freckles: Secondary | ICD-10-CM | POA: Diagnosis not present

## 2020-12-13 DIAGNOSIS — D229 Melanocytic nevi, unspecified: Secondary | ICD-10-CM | POA: Diagnosis not present

## 2020-12-13 DIAGNOSIS — L821 Other seborrheic keratosis: Secondary | ICD-10-CM | POA: Diagnosis not present

## 2021-01-09 DIAGNOSIS — H2512 Age-related nuclear cataract, left eye: Secondary | ICD-10-CM | POA: Diagnosis not present

## 2021-01-09 DIAGNOSIS — Z01818 Encounter for other preprocedural examination: Secondary | ICD-10-CM | POA: Diagnosis not present

## 2021-01-18 ENCOUNTER — Institutional Professional Consult (permissible substitution): Payer: BLUE CROSS/BLUE SHIELD | Admitting: Internal Medicine

## 2021-01-25 DIAGNOSIS — H2512 Age-related nuclear cataract, left eye: Secondary | ICD-10-CM | POA: Diagnosis not present

## 2021-02-01 ENCOUNTER — Institutional Professional Consult (permissible substitution): Payer: BLUE CROSS/BLUE SHIELD | Admitting: Pulmonary Disease

## 2021-02-08 DIAGNOSIS — H2511 Age-related nuclear cataract, right eye: Secondary | ICD-10-CM | POA: Diagnosis not present

## 2021-02-14 ENCOUNTER — Other Ambulatory Visit: Payer: Self-pay | Admitting: Family Medicine

## 2021-07-29 ENCOUNTER — Other Ambulatory Visit: Payer: Self-pay | Admitting: Family Medicine

## 2021-10-30 ENCOUNTER — Other Ambulatory Visit: Payer: Self-pay | Admitting: Family Medicine

## 2021-10-30 NOTE — Telephone Encounter (Signed)
Pt is due for his CPE after 11/16/21, please schedule CPE and lab prior appt and then route back to me to fill med

## 2021-10-30 NOTE — Telephone Encounter (Signed)
Called pt and got him scheduled 3/14 for labs and 3/21 for CPE.

## 2021-12-03 ENCOUNTER — Telehealth: Payer: Self-pay | Admitting: Family Medicine

## 2021-12-03 DIAGNOSIS — N401 Enlarged prostate with lower urinary tract symptoms: Secondary | ICD-10-CM

## 2021-12-03 DIAGNOSIS — Z0000 Encounter for general adult medical examination without abnormal findings: Secondary | ICD-10-CM

## 2021-12-03 DIAGNOSIS — I10 Essential (primary) hypertension: Secondary | ICD-10-CM

## 2021-12-03 DIAGNOSIS — E7800 Pure hypercholesterolemia, unspecified: Secondary | ICD-10-CM

## 2021-12-03 NOTE — Telephone Encounter (Signed)
-----   Message from Ellamae Sia sent at 11/21/2021  3:58 PM EST ----- ?Regarding: lab orders for Tuesday, 3.14.23 ?Patient is scheduled for CPX labs, please order future labs, Thanks , Terri ? ? ?

## 2021-12-04 ENCOUNTER — Other Ambulatory Visit (INDEPENDENT_AMBULATORY_CARE_PROVIDER_SITE_OTHER): Payer: Medicare Other

## 2021-12-04 ENCOUNTER — Other Ambulatory Visit: Payer: Self-pay

## 2021-12-04 DIAGNOSIS — N401 Enlarged prostate with lower urinary tract symptoms: Secondary | ICD-10-CM

## 2021-12-04 DIAGNOSIS — I10 Essential (primary) hypertension: Secondary | ICD-10-CM

## 2021-12-04 DIAGNOSIS — E7800 Pure hypercholesterolemia, unspecified: Secondary | ICD-10-CM | POA: Diagnosis not present

## 2021-12-04 LAB — LIPID PANEL
Cholesterol: 146 mg/dL (ref 0–200)
HDL: 26 mg/dL — ABNORMAL LOW (ref >39.00)
NonHDL: 120.46
Total CHOL/HDL Ratio: 6
Triglycerides: 297 mg/dL — ABNORMAL HIGH (ref 0.0–149.0)
VLDL: 59.4 mg/dL — ABNORMAL HIGH (ref 0.0–40.0)

## 2021-12-04 LAB — CBC WITH DIFFERENTIAL/PLATELET
Basophils Absolute: 0.1 10*3/uL (ref 0.0–0.1)
Basophils Relative: 1.2 % (ref 0.0–3.0)
Eosinophils Absolute: 0.2 10*3/uL (ref 0.0–0.7)
Eosinophils Relative: 2.1 % (ref 0.0–5.0)
HCT: 43.4 % (ref 39.0–52.0)
Hemoglobin: 14.8 g/dL (ref 13.0–17.0)
Lymphocytes Relative: 25.2 % (ref 12.0–46.0)
Lymphs Abs: 2.1 10*3/uL (ref 0.7–4.0)
MCHC: 34.1 g/dL (ref 30.0–36.0)
MCV: 92 fl (ref 78.0–100.0)
Monocytes Absolute: 0.5 10*3/uL (ref 0.1–1.0)
Monocytes Relative: 6.1 % (ref 3.0–12.0)
Neutro Abs: 5.5 10*3/uL (ref 1.4–7.7)
Neutrophils Relative %: 65.4 % (ref 43.0–77.0)
Platelets: 284 10*3/uL (ref 150.0–400.0)
RBC: 4.72 Mil/uL (ref 4.22–5.81)
RDW: 13.5 % (ref 11.5–15.5)
WBC: 8.4 10*3/uL (ref 4.0–10.5)

## 2021-12-04 LAB — PSA: PSA: 0.72 ng/mL (ref 0.10–4.00)

## 2021-12-04 LAB — COMPREHENSIVE METABOLIC PANEL
ALT: 12 U/L (ref 0–53)
AST: 13 U/L (ref 0–37)
Albumin: 4.2 g/dL (ref 3.5–5.2)
Alkaline Phosphatase: 102 U/L (ref 39–117)
BUN: 14 mg/dL (ref 6–23)
CO2: 26 mEq/L (ref 19–32)
Calcium: 9.2 mg/dL (ref 8.4–10.5)
Chloride: 107 mEq/L (ref 96–112)
Creatinine, Ser: 0.9 mg/dL (ref 0.40–1.50)
GFR: 89.45 mL/min (ref >60.00)
Glucose, Bld: 106 mg/dL — ABNORMAL HIGH (ref 70–99)
Potassium: 4.2 mEq/L (ref 3.5–5.1)
Sodium: 139 mEq/L (ref 135–145)
Total Bilirubin: 0.4 mg/dL (ref 0.2–1.2)
Total Protein: 6.6 g/dL (ref 6.0–8.3)

## 2021-12-04 LAB — LDL CHOLESTEROL, DIRECT: Direct LDL: 97 mg/dL

## 2021-12-04 LAB — TSH: TSH: 1.83 u[IU]/mL (ref 0.35–5.50)

## 2021-12-11 ENCOUNTER — Other Ambulatory Visit: Payer: Self-pay

## 2021-12-11 ENCOUNTER — Ambulatory Visit (INDEPENDENT_AMBULATORY_CARE_PROVIDER_SITE_OTHER): Payer: Medicare Other | Admitting: Family Medicine

## 2021-12-11 ENCOUNTER — Encounter: Payer: Self-pay | Admitting: Family Medicine

## 2021-12-11 VITALS — BP 132/70 | HR 74 | Ht 71.0 in | Wt 186.0 lb

## 2021-12-11 DIAGNOSIS — Z23 Encounter for immunization: Secondary | ICD-10-CM | POA: Diagnosis not present

## 2021-12-11 DIAGNOSIS — N401 Enlarged prostate with lower urinary tract symptoms: Secondary | ICD-10-CM | POA: Diagnosis not present

## 2021-12-11 DIAGNOSIS — I10 Essential (primary) hypertension: Secondary | ICD-10-CM

## 2021-12-11 DIAGNOSIS — F17210 Nicotine dependence, cigarettes, uncomplicated: Secondary | ICD-10-CM

## 2021-12-11 DIAGNOSIS — Z800 Family history of malignant neoplasm of digestive organs: Secondary | ICD-10-CM | POA: Diagnosis not present

## 2021-12-11 DIAGNOSIS — Z125 Encounter for screening for malignant neoplasm of prostate: Secondary | ICD-10-CM

## 2021-12-11 DIAGNOSIS — E7800 Pure hypercholesterolemia, unspecified: Secondary | ICD-10-CM | POA: Diagnosis not present

## 2021-12-11 DIAGNOSIS — Z0000 Encounter for general adult medical examination without abnormal findings: Secondary | ICD-10-CM | POA: Diagnosis not present

## 2021-12-11 MED ORDER — TAMSULOSIN HCL 0.4 MG PO CAPS: ORAL_CAPSULE | ORAL | 3 refills | Status: DC

## 2021-12-11 MED ORDER — AMLODIPINE BESYLATE 10 MG PO TABS: 10.0000 mg | ORAL_TABLET | Freq: Every day | ORAL | 3 refills | Status: DC

## 2021-12-11 MED ORDER — ROSUVASTATIN CALCIUM 5 MG PO TABS: 5.0000 mg | ORAL_TABLET | Freq: Every day | ORAL | 3 refills | Status: DC

## 2021-12-11 NOTE — Assessment & Plan Note (Signed)
Significant clinical improvement with Flomax 0.4 mg daily ?Nocturia is down to 0-1 time per night ?PSA is reduced as well ?Lab Results  ?Component Value Date  ? PSA 0.72 12/04/2021  ? PSA 1.41 11/09/2020  ? PSA 1.08 08/13/2019  ? ? ? ?

## 2021-12-11 NOTE — Assessment & Plan Note (Signed)
Disc goals for lipids and reasons to control them ?Rev last labs with pt ?Rev low sat fat diet in detail ?LDL is down to 78 ?Unfortunately HDL is still low encouraged exercise for this ?Plan to continue Crestor 5 mg daily and good diet ?Patient confesses to eating a lot of convenience foods ?

## 2021-12-11 NOTE — Assessment & Plan Note (Signed)
Disc in detail risks of smoking and possible outcomes including copd, vascular/ heart disease, cancer , respiratory and sinus infections  ?Pt voices understanding ?Patient notices changes in breathing with time and knows he will have to quit at some point ?He is not ready to start quitting now ?Some degree of smoker's cough is noted ?Instructed patient to let us know if his breathing changes or if he feels he needs to use an inhaler regularly unsure if he would be interested in [place] lung cancer screening, he has declined in the past ?

## 2021-12-11 NOTE — Assessment & Plan Note (Signed)
bp in fair control at this time  ?BP Readings from Last 1 Encounters:  ?12/11/21 132/70  ? ?No changes needed ?Most recent labs reviewed  ?Disc lifstyle change with low sodium diet and exercise  ?Plan to continue amlodipine 10 mg daily ?

## 2021-12-11 NOTE — Assessment & Plan Note (Addendum)
Reviewed health habits including diet and exercise and skin cancer prevention ?Reviewed appropriate screening tests for age  ?Also reviewed health mt list, fam hx and immunization status , as well as social and family history   ?See HPI ?Labs reviewed ?Patient declines flu vaccine ?Prevnar 13 given today ?Not interested in Shingrix vaccine yet ?No falls or fractures ?Regular job is physical ?PSA reviewed ?Colonoscopy was in 7 of 2016 with a 5-year recall-this was ordered and pt will call to schedule ?Given materials to work on advance directive ?No recent changes in cognitive function or concerns ?Hearing screen is better in the right ear, patient did not client denies any problems and declines further evaluation ?Eye and vision exam is up-to-date with LASEK surgery in May 2022 ?PHQ score 0 ?No help needed with ADLs, functionality is excellent ?Discussed: Goal of smoking cessation ?

## 2021-12-11 NOTE — Patient Instructions (Addendum)
If you are interested in the new shingles vaccine (Shingrix) - call your local pharmacy to check on coverage and availability  ?If affordable, get on a wait list at your pharmacy to get the vaccine. ? ?Please work on your advanced directive and get it notarized  ? ?Flonase or nasacort nasal spray are good for allergies  ? ? ?Call and schedule your colonoscopy  ? ? ?Colonoscopies will be called according to date/time of the referral entry as these are not of urgent need.   ? ?[facility]  [phone] ?[facility]  [phone] ?[facility]  [phone] ? ?[facility] can be considered as well. They may be able to book sooner appointments. You can reach [facility] at [phone].  ? ?Thank you for your patience and please let us know if you have any questions or concerns.  ? ? ?Thank You! ? ?- [facility] ? ?

## 2021-12-11 NOTE — Progress Notes (Signed)
? ?Subjective:  ? ? Patient ID: [name] [name], male    DOB: 08/14/1956, 66 y.o.   MRN: [id number] ? ?This visit occurred during the SARS-CoV-2 public health emergency.  Safety protocols were in place, including screening questions prior to the visit, additional usage of staff PPE, and extensive cleaning of exam room while observing appropriate contact time as indicated for disinfecting solutions.  ? ?HPI ?Pt presents for welcome to medicare visit  ? ?I have personally reviewed the Medicare Annual Wellness questionnaire and have noted ?1. The patient's medical and social history ?2. Their use of alcohol, tobacco or illicit drugs ?3. Their current medications and supplements ?4. The patient's functional ability including ADL's, fall risks, home safety risks and hearing or visual ?            impairment. ?5. Diet and physical activities ?6. Evidence for depression or mood disorders ? ?The patients weight, height, BMI have been recorded in the chart and visual acuity is per eye clinic.  ?I have made referrals, counseling and provided education to the patient based review of the above and I have provided the pt with a written personalized care plan for preventive services. ?Reviewed and updated provider list, see scanned forms. ? ?See scanned forms.  Routine anticipatory guidance given to patient.  See health maintenance. ?Colon cancer screening 7/16 colonoscopy with 5 y recall  ? ?Flu vaccine declines  ?Tetanus vaccine  Tdap 2016  ?Pneumovax due for prevnar 13-will do today  ?Zoster vaccine: not interested  ? ?Falls- none  ?Fractures-none  ?Supplements-none ?Exercise - very physical job  ? ?Prostate cancer screening ?BPH ?On flomax helping his urinary symptoms , urinates less frequently  ?Nocturia is 0-1 ? ?Lab Results  ?Component Value Date  ? PSA 0.72 12/04/2021  ? PSA 1.41 11/09/2020  ? PSA 1.08 08/13/2019  ? ? ? ?Advance directive: given materials to work on  ?Cognitive function addressed- see scanned forms- and  if abnormal then additional documentation follows.  ? ?PMH and SH reviewed ? ?Meds, vitals, and allergies reviewed.  ? ?ROS: See HPI.  Otherwise negative.   ? ?Weight : ?Wt Readings from Last 3 Encounters:  ?12/11/21 186 lb (84.4 kg)  ?11/16/20 182 lb (82.6 kg)  ?12/30/19 188 lb 1 oz (85.3 kg)  ? ?25.94 kg/m? ? ? ?Hearing/vision: ?Hearing Screening  ? '500Hz'$  '1000Hz'$  '2000Hz'$  '4000Hz'$   ?Right ear 40 40 40 40  ?Left ear 0 0 40 40  ? ?No problems with hearing at all  ?He had lasik surgery 5/22 with an exam  ? ?PHQ:0 ? ?ADLs: no help needed  ? ?Functionality: excellent  ? ?Care team : ?Giah Fickett-pcp ?Lucia Gaskins- ENT ?[facility]-pulmonary ? ?Smoking status : is the same  ?Will have to quit soon , not ready yet  ?Wants to cut back first  ?Too much stress right now  ? ?HTN ?bp is stable today  ?No cp or palpitations or headaches or edema  ?No side effects to medicines  ?BP Readings from Last 3 Encounters:  ?12/11/21 140/74  ?11/16/20 128/74  ?12/30/19 122/76  ?Taking amlodipine 10 mg daily ? ? ?Hyperlipidemia ?Lab Results  ?Component Value Date  ? CHOL 146 12/04/2021  ? CHOL 140 11/09/2020  ? CHOL 148 08/13/2019  ? ?Lab Results  ?Component Value Date  ? HDL 26.00 (L) 12/04/2021  ? HDL 27.00 (L) 11/09/2020  ? HDL 28.30 (L) 08/13/2019  ? ?Lab Results  ?Component Value Date  ? [place] 78 11/09/2020  ? LDLCALC 102 (H)  08/13/2019  ? [place] 83 04/27/2018  ? ?Lab Results  ?Component Value Date  ? TRIG 297.0 (H) 12/04/2021  ? TRIG 177.0 (H) 11/09/2020  ? TRIG 86.0 08/13/2019  ? ?Lab Results  ?Component Value Date  ? CHOLHDL 6 12/04/2021  ? CHOLHDL 5 11/09/2020  ? CHOLHDL 5 08/13/2019  ? ?Lab Results  ?Component Value Date  ? LDLDIRECT 97.0 12/04/2021  ? LDLDIRECT 128.0 03/01/2016  ? LDLDIRECT 133.0 01/30/2015  ? ?Crestor 5 mg daily  ?Diet is fair  ?Convenience eating  ?Cookie/nabs in am  ?Then what he finds at home  ? ?The 10-year ASCVD risk score (Arnett DK, et al., 2019) is: 24.5% ?  Values used to calculate the score: ?    Age: 51 years ?    Sex:  Male ?    Is Non-Hispanic African American: No ?    Diabetic: No ?    Tobacco smoker: Yes ?    Systolic Blood Pressure: 643 mmHg ?    Is BP treated: Yes ?    HDL Cholesterol: 26 mg/dL ?    Total Cholesterol: 146 mg/dL ? ?Pt is not interested in cardiology referral at this time  ?Offered this to discuss risk factors  ? ? ? ?Other labs  ?Lab Results  ?Component Value Date  ? CREATININE 0.90 12/04/2021  ? BUN 14 12/04/2021  ? NA 139 12/04/2021  ? K 4.2 12/04/2021  ? CL 107 12/04/2021  ? CO2 26 12/04/2021  ? ?Lab Results  ?Component Value Date  ? ALT 12 12/04/2021  ? AST 13 12/04/2021  ? ALKPHOS 102 12/04/2021  ? BILITOT 0.4 12/04/2021  ? ?Glucose 106 ?Lab Results  ?Component Value Date  ? WBC 8.4 12/04/2021  ? HGB 14.8 12/04/2021  ? HCT 43.4 12/04/2021  ? MCV 92.0 12/04/2021  ? PLT 284.0 12/04/2021  ? ?Lab Results  ?Component Value Date  ? TSH 1.83 12/04/2021  ? ? ?Patient Active Problem List  ? Diagnosis Date Noted  ? AK (actinic keratosis) 11/16/2020  ? Neck pain on left side 12/30/2019  ? Benign head tremor 12/30/2019  ? Localized swelling, mass or lump of neck 12/30/2019  ? Peyronie's disease 08/13/2019  ? Weight loss 08/13/2019  ? History of kidney stones 04/27/2018  ? Chronic left shoulder pain 11/07/2017  ? Cough 01/30/2015  ? Essential hypertension 01/30/2015  ? Family history of colon cancer 01/30/2015  ? Welcome to Medicare preventive visit 05/18/2012  ? Prostate cancer screening 05/18/2012  ? LATERAL EPICONDYLITIS 04/26/2010  ? BACK PAIN, LUMBAR 07/07/2008  ? NONSPECIFIC MESENTERIC LYMPHADENITIS 06/08/2008  ? COLONIC POLYPS 01/27/2008  ? Cigarette smoker 01/27/2008  ? FOOT PAIN, LEFT 01/27/2008  ? SYNCOPE 01/27/2008  ? FATIGUE 01/27/2008  ? Hyperlipidemia 01/26/2008  ? GOUT 01/26/2008  ? GERD 01/26/2008  ? BPH (benign prostatic hyperplasia) 01/26/2008  ? ?Past Medical History:  ?Diagnosis Date  ? Benign prostatic hypertrophy   ? GERD (gastroesophageal reflux disease)   ? Gout   ? Hyperlipidemia   ? no meds   ? Hypertension   ? ?Past Surgical History:  ?Procedure Laterality Date  ? APPENDECTOMY    ? CARPAL TUNNEL RELEASE    ? CHOLECYSTECTOMY    ? COLONOSCOPY  04/2001 6/09  ? polyps  ? liver hemangioma  9/09  ? ?Social History  ? ?Tobacco Use  ? Smoking status: Every Day  ?  Packs/day: 1.00  ?  Years: 51.00  ?  Pack years: 51.00  ?  Types: Cigarettes  ?  Start date: 1970  ? Smokeless tobacco: Never  ?Substance Use Topics  ? Alcohol use: No  ?  Alcohol/week: 0.0 standard drinks  ? Drug use: No  ? ?Family History  ?Problem Relation Age of Onset  ? Cancer Father   ? Colon cancer Father   ? Heart disease Brother   ? Esophageal cancer Neg Hx   ? Stomach cancer Neg Hx   ? Rectal cancer Neg Hx   ? ?Allergies  ?Allergen Reactions  ? Chantix [Varenicline Tartrate]   ?  Insomnia   ? Lipitor [Atorvastatin]   ?  Leg pain  ? Watermelon [Citrullus Vulgaris]   ?  Thinks it gave  Him hives  ? ?Current Outpatient Medications on File Prior to Visit  ?Medication Sig Dispense Refill  ? ibuprofen (ADVIL,MOTRIN) 200 MG tablet Take 600 mg by mouth at bedtime as needed for mild pain.    ? ?No current facility-administered medications on file prior to visit.  ?  ?Review of Systems  ?Constitutional:  Positive for fatigue. Negative for activity change, appetite change, fever and unexpected weight change.  ?HENT:  Negative for congestion, rhinorrhea, sore throat and trouble swallowing.   ?Eyes:  Negative for pain, redness, itching and visual disturbance.  ?Respiratory:  Positive for cough. Negative for chest tightness, shortness of breath and wheezing.   ?Cardiovascular:  Negative for chest pain and palpitations.  ?Gastrointestinal:  Negative for abdominal pain, blood in stool, constipation, diarrhea and nausea.  ?Endocrine: Negative for cold intolerance, heat intolerance, polydipsia and polyuria.  ?Genitourinary:  Negative for difficulty urinating, dysuria, frequency and urgency.  ?Musculoskeletal:  Positive for neck pain. Negative for arthralgias,  joint swelling and myalgias.  ?Skin:  Negative for pallor and rash.  ?Neurological:  Negative for dizziness, tremors, weakness, numbness and headaches.  ?Hematological:  Negative for adenopathy. Does

## 2021-12-11 NOTE — Assessment & Plan Note (Signed)
Lab Results  ?Component Value Date  ? PSA 0.72 12/04/2021  ? PSA 1.41 11/09/2020  ? PSA 1.08 08/13/2019  ? ? ? ?

## 2022-01-28 ENCOUNTER — Ambulatory Visit (AMBULATORY_SURGERY_CENTER): Payer: Medicare Other | Admitting: *Deleted

## 2022-01-28 ENCOUNTER — Encounter: Payer: Self-pay | Admitting: Gastroenterology

## 2022-01-28 VITALS — Ht 72.0 in | Wt 190.0 lb

## 2022-01-28 DIAGNOSIS — Z86010 Personal history of colonic polyps: Secondary | ICD-10-CM

## 2022-01-28 MED ORDER — NA SULFATE-K SULFATE-MG SULF 17.5-3.13-1.6 GM/177ML PO SOLN: 1.0000 | Freq: Once | ORAL | 0 refills | Status: AC

## 2022-01-28 NOTE — Progress Notes (Signed)

## 2022-02-11 ENCOUNTER — Encounter: Payer: Self-pay | Admitting: Gastroenterology

## 2022-02-11 ENCOUNTER — Ambulatory Visit (AMBULATORY_SURGERY_CENTER): Payer: Medicare Other | Admitting: Gastroenterology

## 2022-02-11 VITALS — BP 131/72 | HR 63 | Temp 98.7°F | Resp 16 | Ht 72.0 in | Wt 190.0 lb

## 2022-02-11 DIAGNOSIS — I10 Essential (primary) hypertension: Secondary | ICD-10-CM | POA: Diagnosis not present

## 2022-02-11 DIAGNOSIS — D123 Benign neoplasm of transverse colon: Secondary | ICD-10-CM

## 2022-02-11 DIAGNOSIS — D120 Benign neoplasm of cecum: Secondary | ICD-10-CM

## 2022-02-11 DIAGNOSIS — Z86010 Personal history of colonic polyps: Secondary | ICD-10-CM

## 2022-02-11 MED ORDER — SODIUM CHLORIDE 0.9 % IV SOLN: 500.0000 mL | Freq: Once | INTRAVENOUS | Status: DC

## 2022-02-11 NOTE — Op Note (Addendum)
[facility] Patient Name: [name] [name] Procedure Date: 02/11/2022 8:31 AM MRN: [id number] Endoscopist: Mallie Mussel L. Loletha Carrow , MD Age: 66 Referring MD:  Date of Birth: 11/30/1955 Gender: Male Account #: [id number] Procedure:                Colonoscopy Indications:              Surveillance: Personal history of adenomatous                            polyps on last colonoscopy > 5 years ago                           Hx of colon cancer in father Medicines:                Monitored Anesthesia Care Procedure:                Pre-Anesthesia Assessment:                           - Prior to the procedure, a History and Physical                            was performed, and patient medications and                            allergies were reviewed. The patient's tolerance of                            previous anesthesia was also reviewed. The risks                            and benefits of the procedure and the sedation                            options and risks were discussed with the patient.                            All questions were answered, and informed consent                            was obtained. Prior Anticoagulants: The patient has                            taken no previous anticoagulant or antiplatelet                            agents. ASA Grade Assessment: II - A patient with                            mild systemic disease. After reviewing the risks                            and benefits, the patient was deemed in  satisfactory condition to undergo the procedure.                           After obtaining informed consent, the colonoscope                            was passed under direct vision. Throughout the                            procedure, the patient's blood pressure, pulse, and                            oxygen saturations were monitored continuously. The                            CF [id number] #[id number] was introduced through the anus                             and advanced to the the cecum, identified by                            appendiceal orifice and ileocecal valve. The                            colonoscopy was performed without difficulty. The                            patient tolerated the procedure well. The quality                            of the bowel preparation was good. The ileocecal                            valve, appendiceal orifice, and rectum were                            photographed. The bowel preparation used was SUPREP. Scope In: 8:42:37 AM Scope Out: 9:03:02 AM Scope Withdrawal Time: 0 hours 17 minutes 38 seconds  Total Procedure Duration: 0 hours 20 minutes 25 seconds  Findings:                 The perianal and digital rectal examinations were                            normal.                           Repeat examination of right colon under NBI                            performed.                           Two sessile polyps were found in the cecum. The  polyps were 2 to 5 mm in size. These polyps were                            removed with a cold snare. Resection and retrieval                            were complete.                           Three sessile polyps were found in the transverse                            colon. The polyps were 3 to 6 mm in size. These                            polyps were removed with a cold snare. Resection                            and retrieval were complete.                           Multiple diverticula were found in the left colon.                           Internal hemorrhoids were found.                           The exam was otherwise without abnormality on                            direct and retroflexion views. Complications:            No immediate complications. Estimated Blood Loss:     Estimated blood loss was minimal. Impression:               - Two 2 to 5 mm polyps in the cecum, removed with a                             cold snare. Resected and retrieved.                           - Three 3 to 6 mm polyps in the transverse colon,                            removed with a cold snare. Resected and retrieved.                           - Diverticulosis in the left colon.                           - Internal hemorrhoids.                           - The examination was otherwise normal on direct  and retroflexion views. Recommendation:           - Patient has a contact number available for                            emergencies. The signs and symptoms of potential                            delayed complications were discussed with the                            patient. Return to normal activities tomorrow.                            Written discharge instructions were provided to the                            patient.                           - Resume previous diet.                           - Continue present medications.                           - Await pathology results.                           - Repeat colonoscopy is recommended for                            surveillance. The colonoscopy date will be                            determined after pathology results from today's                            exam become available for review. Laylaa Guevarra L. Loletha Carrow, MD 02/11/2022 9:12:57 AM This report has been signed electronically.

## 2022-02-11 NOTE — Progress Notes (Signed)
Per Dr. Loletha Carrow he spoke with pt and  his son, asked if pt had an urologist that he has seen.  Per pt "yes, I have seen one".  Dr. Loletha Carrow spoke with pt and his son and told them his prostate was "little large".  Dr. Loletha Carrow advised pt to make an appointment to see urologist regarding his prostate.    No problems noted in the recovery room. maw

## 2022-02-11 NOTE — Progress Notes (Signed)
Called to room to assist during endoscopic procedure.  Patient ID and intended procedure confirmed with present staff. Received instructions for my participation in the procedure from the performing physician.  

## 2022-02-11 NOTE — Progress Notes (Signed)
I have reviewed the patient's medical history in detail and updated the computerized patient record.   VS by DT.

## 2022-02-11 NOTE — Progress Notes (Signed)
History and Physical:  This patient presents for endoscopic testing for: Encounter Diagnosis  Name Primary?   Personal history of colonic polyps Yes    TA polyp 2016, HP polyp 2009 Patient denies chronic abdominal pain, rectal bleeding, constipation or diarrhea.   Patient is otherwise without complaints or active issues today.   Past Medical History: Past Medical History:  Diagnosis Date   Arthritis    "LITTLE IN NECK"   Benign prostatic hypertrophy    Cataract    COPD (chronic obstructive pulmonary disease) (HCC)    GERD (gastroesophageal reflux disease)    Gout    Hyperlipidemia    no meds   Hypertension      Past Surgical History: Past Surgical History:  Procedure Laterality Date   APPENDECTOMY     CARPAL TUNNEL RELEASE     CATARACT EXTRACTION     CHOLECYSTECTOMY     COLONOSCOPY  04/2001 6/09   polyps   LASIK     liver hemangioma  05/24/2008   POLYPECTOMY      Allergies: Allergies  Allergen Reactions   Chantix [Varenicline Tartrate]     Insomnia    Lipitor [Atorvastatin]     Leg pain   Watermelon [Citrullus Vulgaris]     Thinks it gave  Him hives    Outpatient Meds: Current Outpatient Medications  Medication Sig Dispense Refill   amLODipine (NORVASC) 10 MG tablet Take 1 tablet (10 mg total) by mouth daily. 90 tablet 3   ibuprofen (ADVIL,MOTRIN) 200 MG tablet Take 600 mg by mouth at bedtime as needed for mild pain.     rosuvastatin (CRESTOR) 5 MG tablet Take 1 tablet (5 mg total) by mouth daily. 90 tablet 3   tamsulosin (FLOMAX) 0.4 MG CAPS capsule TAKE 1 CAPSULE BY MOUTH EVERY DAY AFTER SUPPER 90 capsule 3   Current Facility-Administered Medications  Medication Dose Route Frequency Provider Last Rate Last Admin   0.9 %  sodium chloride infusion  500 mL Intravenous Once Nelida Meuse III, MD          ___________________________________________________________________ Objective   Exam:  BP 134/73   Pulse (!) 57   Temp 98.7 F (37.1 C)  (Temporal)   Resp 16   Ht 6' (1.829 m)   Wt 190 lb (86.2 kg)   SpO2 96%   BMI 25.77 kg/m   CV: RRR without murmur, S1/S2 Resp: clear to auscultation bilaterally, normal RR and effort noted GI: soft, no tenderness, with active bowel sounds.   Assessment: Encounter Diagnosis  Name Primary?   Personal history of colonic polyps Yes     Plan: Colonoscopy  The benefits and risks of the planned procedure were described in detail with the patient or (when appropriate) their health care proxy.  Risks were outlined as including, but not limited to, bleeding, infection, perforation, adverse medication reaction leading to cardiac or pulmonary decompensation, pancreatitis (if ERCP).  The limitation of incomplete mucosal visualization was also discussed.  No guarantees or warranties were given.    The patient is appropriate for an endoscopic procedure in the ambulatory setting.   - Wilfrid Lund, MD

## 2022-02-11 NOTE — Progress Notes (Signed)
Report to PACU, RN, vss, BBS= Clear.  

## 2022-02-11 NOTE — Patient Instructions (Addendum)
Handouts were given to your care partner on polyps, diverticulosis, and hemorrhoids. You may resume your current medications today. Await biopsy results.  May take 1-3 weeks to receive pathology results. Per Dr. Loletha Carrow reach out to your urologist and tell him Dr. Loletha Carrow said your prostate was a little large and let him follow up on this. Please call if any questions or concerns.      YOU HAD AN ENDOSCOPIC PROCEDURE TODAY AT [facility]:   Refer to the procedure report that was given to you for any specific questions about what was found during the examination.  If the procedure report does not answer your questions, please call your gastroenterologist to clarify.  If you requested that your care partner not be given the details of your procedure findings, then the procedure report has been included in a sealed envelope for you to review at your convenience later.  YOU SHOULD EXPECT: Some feelings of bloating in the abdomen. Passage of more gas than usual.  Walking can help get rid of the air that was put into your GI tract during the procedure and reduce the bloating. If you had a lower endoscopy (such as a colonoscopy or flexible sigmoidoscopy) you may notice spotting of blood in your stool or on the toilet paper. If you underwent a bowel prep for your procedure, you may not have a normal bowel movement for a few days.  Please Note:  You might notice some irritation and congestion in your nose or some drainage.  This is from the oxygen used during your procedure.  There is no need for concern and it should clear up in a day or so.  SYMPTOMS TO REPORT IMMEDIATELY:  Following lower endoscopy (colonoscopy or flexible sigmoidoscopy):  Excessive amounts of blood in the stool  Significant tenderness or worsening of abdominal pains  Swelling of the abdomen that is new, acute  Fever of 100F or higher   For urgent or emergent issues, a gastroenterologist can be reached at any hour by  calling [phone]. Do not use MyChart messaging for urgent concerns.    DIET:  We do recommend a small meal at first, but then you may proceed to your regular diet.  Drink plenty of fluids but you should avoid alcoholic beverages for 24 hours.  ACTIVITY:  You should plan to take it easy for the rest of today and you should NOT DRIVE or use heavy machinery until tomorrow (because of the sedation medicines used during the test).    FOLLOW UP: Our staff will call the number listed on your records 48-72 hours following your procedure to check on you and address any questions or concerns that you may have regarding the information given to you following your procedure. If we do not reach you, we will leave a message.  We will attempt to reach you two times.  During this call, we will ask if you have developed any symptoms of COVID 19. If you develop any symptoms (ie: fever, flu-like symptoms, shortness of breath, cough etc.) before then, please call [phone].  If you test positive for Covid 19 in the 2 weeks post procedure, please call and report this information to [place].    If any biopsies were taken you will be contacted by phone or by letter within the next 1-3 weeks.  Please call us at [phone] if you have not heard about the biopsies in 3 weeks.    SIGNATURES/CONFIDENTIALITY: You and/or your care partner  have signed paperwork which will be entered into your electronic medical record.  These signatures attest to the fact that that the information above on your After Visit Summary has been reviewed and is understood.  Full responsibility of the confidentiality of this discharge information lies with you and/or your care-partner.

## 2022-02-12 ENCOUNTER — Telehealth: Payer: Self-pay | Admitting: *Deleted

## 2022-02-12 NOTE — Telephone Encounter (Signed)
  Follow up Call-     02/11/2022    7:45 AM  Call back number  Post procedure Call Back phone  # [phone]  Permission to leave phone message Yes     Patient questions:  Do you have a fever, pain , or abdominal swelling? No. Pain Score  0 *  Have you tolerated food without any problems? Yes.    Have you been able to return to your normal activities? Yes.    Do you have any questions about your discharge instructions: Diet   No. Medications  No. Follow up visit  No.  Do you have questions or concerns about your Care? No.  Actions: * If pain score is 4 or above: No action needed, pain <4.

## 2022-02-14 ENCOUNTER — Encounter: Payer: Self-pay | Admitting: Gastroenterology

## 2022-12-16 ENCOUNTER — Ambulatory Visit (INDEPENDENT_AMBULATORY_CARE_PROVIDER_SITE_OTHER): Payer: Medicare Other

## 2022-12-16 ENCOUNTER — Other Ambulatory Visit: Payer: Self-pay | Admitting: Family Medicine

## 2022-12-16 VITALS — Ht 71.0 in | Wt 185.0 lb

## 2022-12-16 DIAGNOSIS — Z0000 Encounter for general adult medical examination without abnormal findings: Secondary | ICD-10-CM | POA: Diagnosis not present

## 2022-12-16 NOTE — Progress Notes (Signed)
I connected with  [name] [name] on 12/16/22 by a audio enabled telemedicine application and verified that I am speaking with the correct person using two identifiers.  Patient Location: Home  Provider Location: Office/Clinic  I discussed the limitations of evaluation and management by telemedicine. The patient expressed understanding and agreed to proceed.  Subjective:   [name] [name] is a 67 y.o. male who presents for Medicare Annual/Subsequent preventive examination.  Review of Systems      Cardiac Risk Factors include: advanced age (>86men, >65 women);hypertension;male gender     Objective:    Today's Vitals   12/16/22 1359  Weight: 185 lb (83.9 kg)  Height: 5\' 11"  (1.803 m)   Body mass index is 25.8 kg/m.     12/16/2022    2:05 PM 03/23/2015    8:27 AM  Advanced Directives  Does Patient Have a Medical Advance Directive? Yes No  Type of Paramedic of [place];Living will   Copy of [place] in Chart? No - copy requested     Current Medications (verified) Outpatient Encounter Medications as of 12/16/2022  Medication Sig   amLODipine (NORVASC) 10 MG tablet Take 1 tablet (10 mg total) by mouth daily.   ibuprofen (ADVIL,MOTRIN) 200 MG tablet Take 600 mg by mouth at bedtime as needed for mild pain.   psyllium (REGULOID) 0.52 g capsule Take 0.52 g by mouth daily.   rosuvastatin (CRESTOR) 5 MG tablet Take 1 tablet (5 mg total) by mouth daily.   tamsulosin (FLOMAX) 0.4 MG CAPS capsule TAKE 1 CAPSULE BY MOUTH EVERY DAY AFTER SUPPER   No facility-administered encounter medications on file as of 12/16/2022.    Allergies (verified) Chantix [varenicline tartrate], Lipitor [atorvastatin], and Watermelon [citrullus vulgaris]   History: Past Medical History:  Diagnosis Date   Arthritis    "LITTLE IN NECK"   Benign prostatic hypertrophy    Cataract    COPD (chronic obstructive pulmonary disease) (HCC)    GERD  (gastroesophageal reflux disease)    Gout    Hyperlipidemia    no meds   Hypertension    Past Surgical History:  Procedure Laterality Date   APPENDECTOMY     CARPAL TUNNEL RELEASE     CATARACT EXTRACTION     CHOLECYSTECTOMY     COLONOSCOPY  04/2001 6/09   polyps   LASIK     liver hemangioma  05/24/2008   POLYPECTOMY     Family History  Problem Relation Age of Onset   Cancer Father    Colon cancer Father    Heart disease Brother    Esophageal cancer Neg Hx    Stomach cancer Neg Hx    Rectal cancer Neg Hx    Crohn's disease Neg Hx    Social History   Socioeconomic History   Marital status: Divorced    Spouse name: Not on file   Number of children: Not on file   Years of education: Not on file   Highest education level: Not on file  Occupational History   Occupation: Septic tank service   Tobacco Use   Smoking status: Every Day    Packs/day: 1.00    Years: 51.00    Additional pack years: 0.00    Total pack years: 51.00    Types: Cigarettes    Start date: 1970   Smokeless tobacco: Never  Vaping Use   Vaping Use: Never used  Substance and Sexual Activity   Alcohol use: No  Alcohol/week: 0.0 standard drinks of alcohol   Drug use: No   Sexual activity: Not on file  Other Topics Concern   Not on file  Social History Narrative   Divorced, 2 children, works as a Administrator.   Social Determinants of Health   Financial Resource Strain: Low Risk  (12/16/2022)   Overall Financial Resource Strain (CARDIA)    Difficulty of Paying Living Expenses: Not hard at all  Food Insecurity: No Food Insecurity (12/16/2022)   Hunger Vital Sign    Worried About Running Out of Food in the Last Year: Never true    Ran Out of Food in the Last Year: Never true  Transportation Needs: No Transportation Needs (12/16/2022)   PRAPARE - Hydrologist (Medical): No    Lack of Transportation (Non-Medical): No  Physical Activity: Inactive (12/16/2022)    Exercise Vital Sign    Days of Exercise per Week: 0 days    Minutes of Exercise per Session: 0 min  Stress: No Stress Concern Present (12/16/2022)   [place]    Feeling of Stress : Not at all  Social Connections: Moderately Isolated (12/16/2022)   Social Connection and Isolation Panel [NHANES]    Frequency of Communication with Friends and Family: More than three times a week    Frequency of Social Gatherings with Friends and Family: More than three times a week    Attends Religious Services: 1 to 4 times per year    Active Member of [employer] or Organizations: No    Attends Music therapist: Never    Marital Status: Divorced    Tobacco Counseling Ready to quit: Not Answered Counseling given: Not Answered   Clinical Intake:  Pre-visit preparation completed: Yes  Pain : No/denies pain     Nutritional Risks: None Diabetes: No  How often do you need to have someone help you when you read instructions, pamphlets, or other written materials from your doctor or pharmacy?: 1 - Never  Diabetic? no  Interpreter Needed?: No  Information entered by :: C.Chey Cho LPN   Activities of Daily Living    12/16/2022    2:05 PM  In your present state of health, do you have any difficulty performing the following activities:  Hearing? 0  Vision? 0  Difficulty concentrating or making decisions? 0  Walking or climbing stairs? 0  Dressing or bathing? 0  Doing errands, shopping? 0  Preparing Food and eating ? N  Using the Toilet? N  In the past six months, have you accidently leaked urine? Y  Comment occasionally  Do you have problems with loss of bowel control? N  Managing your Medications? N  Managing your Finances? N  Housekeeping or managing your Housekeeping? N    Patient Care Team: Tower, Wynelle Fanny, MD as PCP - General  Indicate any recent Medical Services you may have received from other than [facility]  providers in the past year (date may be approximate).     Assessment:   This is a routine wellness examination for [name] [name].  Hearing/Vision screen Hearing Screening - Comments:: No aids Vision Screening - Comments:: No glasses  Dietary issues and exercise activities discussed: Current Exercise Habits: The patient has a physically strenuous job, but has no regular exercise apart from work., Exercise limited by: None identified   Goals Addressed             This Visit's Progress  Patient Stated       No goals       Depression Screen    12/16/2022    2:04 PM 12/11/2021    9:13 AM 12/11/2021    8:39 AM 11/16/2020    8:36 AM 08/13/2019    9:09 AM 04/27/2018    8:49 AM 04/09/2017    6:35 PM  PHQ 2/9 Scores  PHQ - 2 Score 0 0 0 0 0 0 0  PHQ- 9 Score     0      Fall Risk    12/16/2022    2:01 PM 12/11/2021    8:38 AM  Fall Risk   Falls in the past year? 0 0  Number falls in past yr: 0   Injury with Fall? 0 0  Risk for fall due to : No Fall Risks   Follow up Falls prevention discussed;Falls evaluation completed Falls evaluation completed    FALL RISK PREVENTION PERTAINING TO THE HOME:  Any stairs in or around the home? Yes  If so, are there any without handrails? No  Home free of loose throw rugs in walkways, pet beds, electrical cords, etc? Yes  Adequate lighting in your home to reduce risk of falls? Yes   ASSISTIVE DEVICES UTILIZED TO PREVENT FALLS:  Life alert? No  Use of a cane, walker or w/c? No  Grab bars in the bathroom? Yes  Shower chair or bench in shower? Yes  Elevated toilet seat or a handicapped toilet? Yes   Cognitive Function:        12/16/2022    2:06 PM  6CIT Screen  What Year? 0 points  What month? 0 points  What time? 0 points  Count back from 20 0 points  Months in reverse 0 points  Repeat phrase 0 points  Total Score 0 points    Immunizations Immunization History  Administered Date(s) Administered   Influenza Whole 06/08/2008    PFIZER(Purple Top)SARS-COV-2 Vaccination 06/05/2020, 06/26/2020   Pneumococcal Conjugate-13 12/11/2021   Pneumococcal Polysaccharide-23 05/18/2012, 08/13/2019   Td 03/29/2004   Tdap 01/30/2015    TDAP status: Up to date  Flu Vaccine status: Declined, Education has been provided regarding the importance of this vaccine but patient still declined. Advised may receive this vaccine at local pharmacy or Health Dept. Aware to provide a copy of the vaccination record if obtained from local pharmacy or Health Dept. Verbalized acceptance and understanding.  Pneumococcal vaccine status: Up to date  Covid-19 vaccine status: Declined, Education has been provided regarding the importance of this vaccine but patient still declined. Advised may receive this vaccine at local pharmacy or Health Dept.or vaccine clinic. Aware to provide a copy of the vaccination record if obtained from local pharmacy or Health Dept. Verbalized acceptance and understanding.  Qualifies for Shingles Vaccine? Yes   Zostavax completed  unknown   Shingrix Completed?: No.    Education has been provided regarding the importance of this vaccine. Patient has been advised to call insurance company to determine out of pocket expense if they have not yet received this vaccine. Advised may also receive vaccine at local pharmacy or Health Dept. Verbalized acceptance and understanding.  Screening Tests Health Maintenance  Topic Date Due   Hepatitis C Screening  Never done   Zoster Vaccines- Shingrix (1 of 2) Never done   Lung Cancer Screening  07/05/2016   COVID-19 Vaccine (3 - Pfizer risk series) 07/24/2020   INFLUENZA VACCINE  04/23/2022   Medicare Annual Wellness (AWV)  12/16/2023   Pneumonia Vaccine 72+ Years old (3 of 3 - PPSV23 or PCV20) 08/12/2024   DTaP/Tdap/Td (3 - Td or Tdap) 01/29/2025   COLONOSCOPY (Pts 45-1yrs Insurance coverage will need to be confirmed)  02/11/2025   HPV VACCINES  Aged Out    Health  Maintenance  Health Maintenance Due  Topic Date Due   Hepatitis C Screening  Never done   Zoster Vaccines- Shingrix (1 of 2) Never done   Lung Cancer Screening  07/05/2016   COVID-19 Vaccine (3 - Pfizer risk series) 07/24/2020   INFLUENZA VACCINE  04/23/2022    Colorectal cancer screening: Type of screening: Colonoscopy. Completed 12/12/2021. Repeat every 3 years  Lung Cancer Screening: (Low Dose CT Chest recommended if Age 39-80 years, 30 pack-year currently smoking OR have quit w/in 15years.) does qualify.   Lung Cancer Screening Referral: Will discuss with PCP  Additional Screening:  Hepatitis C Screening: does qualify; Completed - due Not done Vision Screening: Recommended annual ophthalmology exams for early detection of glaucoma and other disorders of the eye. Is the patient up to date with their annual eye exam?  No  Who is the provider or what is the name of the office in which the patient attends annual eye exams? Unknown Provider- pt will call to schedule appointment. If pt is not established with a provider, would they like to be referred to a provider to establish care? No .   Dental Screening: Recommended annual dental exams for proper oral hygiene  Community Resource Referral / Chronic Care Management: CRR required this visit?  No   CCM required this visit?  No      Plan:     I have personally reviewed and noted the following in the patient's chart:   Medical and social history Use of alcohol, tobacco or illicit drugs  Current medications and supplements including opioid prescriptions. Patient is not currently taking opioid prescriptions. Functional ability and status Nutritional status Physical activity Advanced directives List of other physicians Hospitalizations, surgeries, and ER visits in previous 12 months Vitals Screenings to include cognitive, depression, and falls Referrals and appointments  In addition, I have reviewed and discussed with  patient certain preventive protocols, quality metrics, and best practice recommendations. A written personalized care plan for preventive services as well as general preventive health recommendations were provided to patient.     Lebron Conners, LPN   [id number]   Nurse Notes: None

## 2022-12-16 NOTE — Patient Instructions (Signed)
[name] [name] , Thank you for taking time to come for your Medicare Wellness Visit. I appreciate your ongoing commitment to your health goals. Please review the following plan we discussed and let me know if I can assist you in the future.   These are the goals we discussed:  Goals      Patient Stated     No goals        This is a list of the screening recommended for you and due dates:  Health Maintenance  Topic Date Due   Hepatitis C Screening: USPSTF Recommendation to screen - Ages 47-79 yo.  Never done   Zoster (Shingles) Vaccine (1 of 2) Never done   Screening for Lung Cancer  07/05/2016   COVID-19 Vaccine (3 - Pfizer risk series) 07/24/2020   Flu Shot  04/23/2022   Medicare Annual Wellness Visit  12/16/2023   Pneumonia Vaccine (3 of 3 - PPSV23 or PCV20) 08/12/2024   DTaP/Tdap/Td vaccine (3 - Td or Tdap) 01/29/2025   Colon Cancer Screening  02/11/2025   HPV Vaccine  Aged Out    Advanced directives: Please bring a copy of your health care power of attorney and living will to the office to be added to your chart at your convenience.   Conditions/risks identified: Aim for 30 minutes of exercise or brisk walking, 6-8 glasses of water, and 5 servings of fruits and vegetables each day.   Next appointment: Follow up in one year for your annual wellness visit. 12/18/23 @ 3:00 telphone visit.  Preventive Care 74 Years and Older, Male  Preventive care refers to lifestyle choices and visits with your health care provider that can promote health and wellness. What does preventive care include? A yearly physical exam. This is also called an annual well check. Dental exams once or twice a year. Routine eye exams. Ask your health care provider how often you should have your eyes checked. Personal lifestyle choices, including: Daily care of your teeth and gums. Regular physical activity. Eating a healthy diet. Avoiding tobacco and drug use. Limiting alcohol use. Practicing safe  sex. Taking low doses of aspirin every day. Taking vitamin and mineral supplements as recommended by your health care provider. What happens during an annual well check? The services and screenings done by your health care provider during your annual well check will depend on your age, overall health, lifestyle risk factors, and family history of disease. Counseling  Your health care provider may ask you questions about your: Alcohol use. Tobacco use. Drug use. Emotional well-being. Home and relationship well-being. Sexual activity. Eating habits. History of falls. Memory and ability to understand (cognition). Work and work Statistician. Screening  You may have the following tests or measurements: Height, weight, and BMI. Blood pressure. Lipid and cholesterol levels. These may be checked every 5 years, or more frequently if you are over 34 years old. Skin check. Lung cancer screening. You may have this screening every year starting at age 26 if you have a 30-pack-year history of smoking and currently smoke or have quit within the past 15 years. Fecal occult blood test (FOBT) of the stool. You may have this test every year starting at age 31. Flexible sigmoidoscopy or colonoscopy. You may have a sigmoidoscopy every 5 years or a colonoscopy every 10 years starting at age 38. Prostate cancer screening. Recommendations will vary depending on your family history and other risks. Hepatitis C blood test. Hepatitis B blood test. Sexually transmitted disease (STD) testing. Diabetes screening.  This is done by checking your blood sugar (glucose) after you have not eaten for a while (fasting). You may have this done every 1-3 years. Abdominal aortic aneurysm (AAA) screening. You may need this if you are a current or former smoker. Osteoporosis. You may be screened starting at age 67 if you are at high risk. Talk with your health care provider about your test results, treatment options, and if  necessary, the need for more tests. Vaccines  Your health care provider may recommend certain vaccines, such as: Influenza vaccine. This is recommended every year. Tetanus, diphtheria, and acellular pertussis (Tdap, Td) vaccine. You may need a Td booster every 10 years. Zoster vaccine. You may need this after age 3. Pneumococcal 13-valent conjugate (PCV13) vaccine. One dose is recommended after age 25. Pneumococcal polysaccharide (PPSV23) vaccine. One dose is recommended after age 4. Talk to your health care provider about which screenings and vaccines you need and how often you need them. This information is not intended to replace advice given to you by your health care provider. Make sure you discuss any questions you have with your health care provider. Document Released: 10/06/2015 Document Revised: 05/29/2016 Document Reviewed: 07/11/2015 Elsevier Interactive Patient Education  2017 [place] Prevention in the Home Falls can cause injuries. They can happen to people of all ages. There are many things you can do to make your home safe and to help prevent falls. What can I do on the outside of my home? Regularly fix the edges of walkways and driveways and fix any cracks. Remove anything that might make you trip as you walk through a door, such as a raised step or threshold. Trim any bushes or trees on the path to your home. Use bright outdoor lighting. Clear any walking paths of anything that might make someone trip, such as rocks or tools. Regularly check to see if handrails are loose or broken. Make sure that both sides of any steps have handrails. Any raised decks and porches should have guardrails on the edges. Have any leaves, snow, or ice cleared regularly. Use sand or salt on walking paths during winter. Clean up any spills in your garage right away. This includes oil or grease spills. What can I do in the bathroom? Use night lights. Install grab bars by the toilet  and in the tub and shower. Do not use towel bars as grab bars. Use non-skid mats or decals in the tub or shower. If you need to sit down in the shower, use a plastic, non-slip stool. Keep the floor dry. Clean up any water that spills on the floor as soon as it happens. Remove soap buildup in the tub or shower regularly. Attach bath mats securely with double-sided non-slip rug tape. Do not have throw rugs and other things on the floor that can make you trip. What can I do in the bedroom? Use night lights. Make sure that you have a light by your bed that is easy to reach. Do not use any sheets or blankets that are too big for your bed. They should not hang down onto the floor. Have a firm chair that has side arms. You can use this for support while you get dressed. Do not have throw rugs and other things on the floor that can make you trip. What can I do in the kitchen? Clean up any spills right away. Avoid walking on wet floors. Keep items that you use a lot in easy-to-reach places. If  you need to reach something above you, use a strong step stool that has a grab bar. Keep electrical cords out of the way. Do not use floor polish or wax that makes floors slippery. If you must use wax, use non-skid floor wax. Do not have throw rugs and other things on the floor that can make you trip. What can I do with my stairs? Do not leave any items on the stairs. Make sure that there are handrails on both sides of the stairs and use them. Fix handrails that are broken or loose. Make sure that handrails are as long as the stairways. Check any carpeting to make sure that it is firmly attached to the stairs. Fix any carpet that is loose or worn. Avoid having throw rugs at the top or bottom of the stairs. If you do have throw rugs, attach them to the floor with carpet tape. Make sure that you have a light switch at the top of the stairs and the bottom of the stairs. If you do not have them, ask someone to add  them for you. What else can I do to help prevent falls? Wear shoes that: Do not have high heels. Have rubber bottoms. Are comfortable and fit you well. Are closed at the toe. Do not wear sandals. If you use a stepladder: Make sure that it is fully opened. Do not climb a closed stepladder. Make sure that both sides of the stepladder are locked into place. Ask someone to hold it for you, if possible. Clearly mark and make sure that you can see: Any grab bars or handrails. First and last steps. Where the edge of each step is. Use tools that help you move around (mobility aids) if they are needed. These include: Canes. Walkers. Scooters. Crutches. Turn on the lights when you go into a dark area. Replace any light bulbs as soon as they burn out. Set up your furniture so you have a clear path. Avoid moving your furniture around. If any of your floors are uneven, fix them. If there are any pets around you, be aware of where they are. Review your medicines with your doctor. Some medicines can make you feel dizzy. This can increase your chance of falling. Ask your doctor what other things that you can do to help prevent falls. This information is not intended to replace advice given to you by your health care provider. Make sure you discuss any questions you have with your health care provider. Document Released: 07/06/2009 Document Revised: 02/15/2016 Document Reviewed: 10/14/2014 Elsevier Interactive Patient Education  2017 [employer].

## 2022-12-17 NOTE — Telephone Encounter (Signed)
Patient has been scheduled

## 2022-12-17 NOTE — Telephone Encounter (Signed)
Pt is overdue for his CPE (labs prior), please schedule and then route back to me to refill, thanks

## 2022-12-24 ENCOUNTER — Telehealth: Payer: Self-pay | Admitting: Family Medicine

## 2022-12-24 DIAGNOSIS — Z125 Encounter for screening for malignant neoplasm of prostate: Secondary | ICD-10-CM

## 2022-12-24 DIAGNOSIS — R7303 Prediabetes: Secondary | ICD-10-CM | POA: Insufficient documentation

## 2022-12-24 DIAGNOSIS — E7800 Pure hypercholesterolemia, unspecified: Secondary | ICD-10-CM

## 2022-12-24 DIAGNOSIS — R7309 Other abnormal glucose: Secondary | ICD-10-CM

## 2022-12-24 DIAGNOSIS — I10 Essential (primary) hypertension: Secondary | ICD-10-CM

## 2022-12-24 DIAGNOSIS — N401 Enlarged prostate with lower urinary tract symptoms: Secondary | ICD-10-CM

## 2022-12-24 NOTE — Telephone Encounter (Signed)
-----   Message from Ellamae Sia sent at 12/17/2022  3:11 PM EDT ----- Regarding: Lab orders for Wednesday, 4.3.24 Patient is scheduled for CPX labs, please order future labs, Thanks , Karna Christmas

## 2022-12-25 ENCOUNTER — Other Ambulatory Visit (INDEPENDENT_AMBULATORY_CARE_PROVIDER_SITE_OTHER): Payer: Medicare Other

## 2022-12-25 DIAGNOSIS — N401 Enlarged prostate with lower urinary tract symptoms: Secondary | ICD-10-CM

## 2022-12-25 DIAGNOSIS — R7309 Other abnormal glucose: Secondary | ICD-10-CM

## 2022-12-25 DIAGNOSIS — I10 Essential (primary) hypertension: Secondary | ICD-10-CM

## 2022-12-25 DIAGNOSIS — E7800 Pure hypercholesterolemia, unspecified: Secondary | ICD-10-CM | POA: Diagnosis not present

## 2022-12-25 DIAGNOSIS — Z125 Encounter for screening for malignant neoplasm of prostate: Secondary | ICD-10-CM

## 2022-12-25 LAB — COMPREHENSIVE METABOLIC PANEL
ALT: 13 U/L (ref 0–53)
AST: 14 U/L (ref 0–37)
Albumin: 4.1 g/dL (ref 3.5–5.2)
Alkaline Phosphatase: 92 U/L (ref 39–117)
BUN: 13 mg/dL (ref 6–23)
CO2: 26 mEq/L (ref 19–32)
Calcium: 8.8 mg/dL (ref 8.4–10.5)
Chloride: 111 mEq/L (ref 96–112)
Creatinine, Ser: 0.85 mg/dL (ref 0.40–1.50)
GFR: 90.34 mL/min (ref >60.00)
Glucose, Bld: 103 mg/dL — ABNORMAL HIGH (ref 70–99)
Potassium: 3.9 mEq/L (ref 3.5–5.1)
Sodium: 141 mEq/L (ref 135–145)
Total Bilirubin: 0.4 mg/dL (ref 0.2–1.2)
Total Protein: 6.6 g/dL (ref 6.0–8.3)

## 2022-12-25 LAB — LIPID PANEL
Cholesterol: 118 mg/dL (ref 0–200)
HDL: 30.4 mg/dL — ABNORMAL LOW (ref >39.00)
LDL Cholesterol: 72 mg/dL (ref 0–99)
NonHDL: 87.99
Total CHOL/HDL Ratio: 4
Triglycerides: 80 mg/dL (ref 0.0–149.0)
VLDL: 16 mg/dL (ref 0.0–40.0)

## 2022-12-25 LAB — CBC WITH DIFFERENTIAL/PLATELET
Basophils Absolute: 0.1 10*3/uL (ref 0.0–0.1)
Basophils Relative: 1.2 % (ref 0.0–3.0)
Eosinophils Absolute: 0.2 10*3/uL (ref 0.0–0.7)
Eosinophils Relative: 1.7 % (ref 0.0–5.0)
HCT: 44.6 % (ref 39.0–52.0)
Hemoglobin: 15.1 g/dL (ref 13.0–17.0)
Lymphocytes Relative: 16.4 % (ref 12.0–46.0)
Lymphs Abs: 1.8 10*3/uL (ref 0.7–4.0)
MCHC: 33.8 g/dL (ref 30.0–36.0)
MCV: 92.6 fl (ref 78.0–100.0)
Monocytes Absolute: 0.5 10*3/uL (ref 0.1–1.0)
Monocytes Relative: 4.9 % (ref 3.0–12.0)
Neutro Abs: 8.2 10*3/uL — ABNORMAL HIGH (ref 1.4–7.7)
Neutrophils Relative %: 75.8 % (ref 43.0–77.0)
Platelets: 265 10*3/uL (ref 150.0–400.0)
RBC: 4.82 Mil/uL (ref 4.22–5.81)
RDW: 13.4 % (ref 11.5–15.5)
WBC: 10.8 10*3/uL — ABNORMAL HIGH (ref 4.0–10.5)

## 2022-12-25 LAB — HEMOGLOBIN A1C: Hgb A1c MFr Bld: 6 % (ref 4.6–6.5)

## 2022-12-25 LAB — PSA, MEDICARE: PSA: 1.22 ng/ml (ref 0.10–4.00)

## 2022-12-25 LAB — TSH: TSH: 1.49 u[IU]/mL (ref 0.35–5.50)

## 2023-01-01 ENCOUNTER — Ambulatory Visit (INDEPENDENT_AMBULATORY_CARE_PROVIDER_SITE_OTHER): Payer: Medicare Other | Admitting: Family Medicine

## 2023-01-01 ENCOUNTER — Encounter: Payer: Self-pay | Admitting: Family Medicine

## 2023-01-01 VITALS — BP 122/64 | HR 57 | Temp 98.0°F | Ht 70.75 in | Wt 176.1 lb

## 2023-01-01 DIAGNOSIS — E7800 Pure hypercholesterolemia, unspecified: Secondary | ICD-10-CM

## 2023-01-01 DIAGNOSIS — Z0000 Encounter for general adult medical examination without abnormal findings: Secondary | ICD-10-CM | POA: Diagnosis not present

## 2023-01-01 DIAGNOSIS — I10 Essential (primary) hypertension: Secondary | ICD-10-CM | POA: Diagnosis not present

## 2023-01-01 DIAGNOSIS — N401 Enlarged prostate with lower urinary tract symptoms: Secondary | ICD-10-CM

## 2023-01-01 DIAGNOSIS — D126 Benign neoplasm of colon, unspecified: Secondary | ICD-10-CM | POA: Diagnosis not present

## 2023-01-01 DIAGNOSIS — L570 Actinic keratosis: Secondary | ICD-10-CM

## 2023-01-01 DIAGNOSIS — R053 Chronic cough: Secondary | ICD-10-CM | POA: Diagnosis not present

## 2023-01-01 DIAGNOSIS — R634 Abnormal weight loss: Secondary | ICD-10-CM

## 2023-01-01 DIAGNOSIS — R7303 Prediabetes: Secondary | ICD-10-CM

## 2023-01-01 DIAGNOSIS — Z122 Encounter for screening for malignant neoplasm of respiratory organs: Secondary | ICD-10-CM

## 2023-01-01 DIAGNOSIS — F17210 Nicotine dependence, cigarettes, uncomplicated: Secondary | ICD-10-CM

## 2023-01-01 DIAGNOSIS — Z125 Encounter for screening for malignant neoplasm of prostate: Secondary | ICD-10-CM

## 2023-01-01 MED ORDER — TAMSULOSIN HCL 0.4 MG PO CAPS: ORAL_CAPSULE | ORAL | 3 refills | Status: DC

## 2023-01-01 MED ORDER — AMLODIPINE BESYLATE 10 MG PO TABS: 10.0000 mg | ORAL_TABLET | Freq: Every day | ORAL | 3 refills | Status: DC

## 2023-01-01 MED ORDER — ROSUVASTATIN CALCIUM 5 MG PO TABS: 5.0000 mg | ORAL_TABLET | Freq: Every day | ORAL | 3 refills | Status: DC

## 2023-01-01 NOTE — Assessment & Plan Note (Signed)
Pt declines pulmonary f/u but is interested in the lung cancer screen program with CT scans Cough is mostly at night Suspect COPD Not using any inhalers or otc meds currently

## 2023-01-01 NOTE — Patient Instructions (Addendum)
To prevent diabetes  Try to get most of your carbohydrates from produce (with the exception of white potatoes)  Eat less bread/pasta/rice/snack foods/cereals/sweets and other items from the middle of the grocery store (processed carbs)  Try to add more nuts to your diet (nut butters are good as well)  In the am - add some more protein if you can   Think about adding more vegetables to your diet    I placed a referral for lung cancer screening program  Also for dermatology visit   I put the referral in  Please let us know if you don't hear in 1-2 weeks   Use sun protection  Stay active

## 2023-01-01 NOTE — Assessment & Plan Note (Signed)
Lab Results  Component Value Date   HGBA1C 6.0 12/25/2022   Prediabetic range disc imp of low glycemic diet and wt loss to prevent DM2

## 2023-01-01 NOTE — Assessment & Plan Note (Signed)
Colonoscopy utd 01/2022 with 3 y recall

## 2023-01-01 NOTE — Assessment & Plan Note (Signed)
Reviewed health habits including diet and exercise and skin cancer prevention Reviewed appropriate screening tests for age  Also reviewed health mt list, fam hx and immunization status , as well as social and family history   See HPI Labs reviewed and ordered See HPI Colonoscopy utd 01/2022 with 3 y recall Psa reviewed  Referred for lung cancer screening program  Counseled on smoking cessation  Counseled on bone health  Ref to derm for AK eval and also skin cancer screening Counseled re: sun protection

## 2023-01-01 NOTE — Assessment & Plan Note (Signed)
Wt is down 9 lb since march  Pt notes he has been eating less with less appetite  May be due to copd  Disc cancer screening  Utd prostate and colon cancer screening Ref for lung cancer screening   Counseled on healthy diet with more protein/less added sugars and more produce

## 2023-01-01 NOTE — Assessment & Plan Note (Signed)
Ref to lung cancer screening program with CT 1ppd for over 30 years 67 yo male  Has copd   Not ready to quit

## 2023-01-01 NOTE — Assessment & Plan Note (Signed)
Treats BPH with flomax No urinary changes   Lab Results  Component Value Date   PSA 1.22 12/25/2022   PSA 0.72 12/04/2021   PSA 1.41 11/09/2020

## 2023-01-01 NOTE — Assessment & Plan Note (Signed)
bp in fair control at this time  BP Readings from Last 1 Encounters:  01/01/23 122/64   No changes needed Most recent labs reviewed  Disc lifstyle change with low sodium diet and exercise  Plan to continue amlodipine 10 mg daily

## 2023-01-01 NOTE — Assessment & Plan Note (Signed)
Per pt no new urinary changes  Taking tamsulosin 0.4 mg and wants to continue Lab Results  Component Value Date   PSA 1.22 12/25/2022   PSA 0.72 12/04/2021   PSA 1.41 11/09/2020

## 2023-01-01 NOTE — Assessment & Plan Note (Signed)
Disc in detail risks of smoking and possible outcomes including copd, vascular/ heart disease, cancer , respiratory and sinus infections  Pt voices understanding 1ppd  Knows he eventually has to quit but not ready yet  May consider otc nicotine gum   Less appetite with time/ some wt loss  He is now open to the lung cancer screening program with serial CT scans (he chooses this rather than pulmonary f/u)  No breathing complaints today but notes in general has worsened with time with some smokers cough worse at night  No inhalers currently

## 2023-01-01 NOTE — Assessment & Plan Note (Signed)
Disc goals for lipids and reasons to control them Rev last labs with pt Rev low sat fat diet in detail LDL is down to 72 Unfortunately HDL is still low encouraged exercise for this Plan to continue Crestor 5 mg daily and good diet Patient confesses to eating for convenience

## 2023-01-01 NOTE — Assessment & Plan Note (Signed)
More on L arm  Ref to derm for eval and tx  Also skin cancer screening  Counseled re: sun protectoin

## 2023-01-01 NOTE — Progress Notes (Signed)
Subjective:    Patient ID: [name] [name], male    DOB: 1956/05/15, 67 y.o.   MRN: [id number]  HPI Here for health maintenance exam and to review chronic medical problems    Wt Readings from Last 3 Encounters:  01/01/23 176 lb 2 oz (79.9 kg)  12/16/22 185 lb (83.9 kg)  02/11/22 190 lb (86.2 kg)   24.74 kg/m  Feeling ok overall   Eats less  Not trying to loose weight but not as hungry   Cookie in am / pack of nabs for lunch and then supper  Eats some fruits Not a lot of vegetables / too much work  He fixes sandwiches   Breathing is a little worse than a year ago  Coughs when he lays down   1 ppd Knows he has to quit  May try nicotine gum    Vitals:   01/01/23 0759  BP: 122/64  Pulse: (!) 57  Temp: 98 F (36.7 C)  SpO2: 100%    Immunization History  Administered Date(s) Administered   Influenza Whole 06/08/2008   PFIZER(Purple Top)SARS-COV-2 Vaccination 06/05/2020, 06/26/2020   Pneumococcal Conjugate-13 12/11/2021   Pneumococcal Polysaccharide-23 05/18/2012, 08/13/2019   Td 03/29/2004   Tdap 01/30/2015   Health Maintenance Due  Topic Date Due   Hepatitis C Screening  Never done   Lung Cancer Screening  07/05/2016   Colonoscopy 01/2022 with 3 y recall   Prostate health H/o BPH Taking flomax  Lab Results  Component Value Date   PSA 1.22 12/25/2022   PSA 0.72 12/04/2021   PSA 1.41 11/09/2020  Doing about the same  No changes from a year ago  Flomax helps    Lung cancer screening -interested in this program more so than pulmonary follow up    Dermatology care  Want last year  Does not think he had anything removed    HTN bp is stable today  No cp or palpitations or headaches or edema  No side effects to medicines  BP Readings from Last 3 Encounters:  01/01/23 122/64  02/11/22 131/72  12/11/21 132/70    Amlodipine 10 mg daily   Pulse Readings from Last 3 Encounters:  01/01/23 (!) 57  02/11/22 63  12/11/21 74   Last metabolic  panel Lab Results  Component Value Date   GLUCOSE 103 (H) 12/25/2022   NA 141 12/25/2022   K 3.9 12/25/2022   CL 111 12/25/2022   CO2 26 12/25/2022   BUN 13 12/25/2022   CREATININE 0.85 12/25/2022   GFRNONAA 44 (L) 03/29/2018   CALCIUM 8.8 12/25/2022   PROT 6.6 12/25/2022   ALBUMIN 4.1 12/25/2022   BILITOT 0.4 12/25/2022   ALKPHOS 92 12/25/2022   AST 14 12/25/2022   ALT 13 12/25/2022   ANIONGAP 5 03/29/2018   GFR of 90.3  Glucose Lab Results  Component Value Date   HGBA1C 6.0 12/25/2022  No diabetes in the family  Protein-not a lot of meat Ham Eggs Nuts    Hyperlipidemia Lab Results  Component Value Date   CHOL 118 12/25/2022   CHOL 146 12/04/2021   CHOL 140 11/09/2020   Lab Results  Component Value Date   HDL 30.40 (L) 12/25/2022   HDL 26.00 (L) 12/04/2021   HDL 27.00 (L) 11/09/2020   Lab Results  Component Value Date   LDLCALC 72 12/25/2022   LDLCALC 78 11/09/2020   LDLCALC 102 (H) 08/13/2019   Lab Results  Component Value Date   TRIG 80.0  12/25/2022   TRIG 297.0 (H) 12/04/2021   TRIG 177.0 (H) 11/09/2020   Lab Results  Component Value Date   CHOLHDL 4 12/25/2022   CHOLHDL 6 12/04/2021   CHOLHDL 5 11/09/2020   Lab Results  Component Value Date   LDLDIRECT 97.0 12/04/2021   LDLDIRECT 128.0 03/01/2016   LDLDIRECT 133.0 01/30/2015   Crestor 5 mg daily  Improved   Lab Results  Component Value Date   WBC 10.8 (H) 12/25/2022   HGB 15.1 12/25/2022   HCT 44.6 12/25/2022   MCV 92.6 12/25/2022   PLT 265.0 12/25/2022   No recent infections Some dental issues  Plans to get them taken care of   Patient Active Problem List   Diagnosis Date Noted   Routine general medical examination at a health care facility 01/01/2023   Screening for lung cancer 01/01/2023   Prediabetes 12/24/2022   AK (actinic keratosis) 11/16/2020   Neck pain on left side 12/30/2019   Benign head tremor 12/30/2019   Localized swelling, mass or lump of neck 12/30/2019    Peyronie's disease 08/13/2019   Weight loss 08/13/2019   History of kidney stones 04/27/2018   Chronic left shoulder pain 11/07/2017   Cough 01/30/2015   Essential hypertension 01/30/2015   Family history of colon cancer 01/30/2015   Prostate cancer screening 05/18/2012   COLONIC POLYPS 01/27/2008   Cigarette smoker 01/27/2008   FOOT PAIN, LEFT 01/27/2008   SYNCOPE 01/27/2008   FATIGUE 01/27/2008   Hyperlipidemia 01/26/2008   GOUT 01/26/2008   BPH (benign prostatic hyperplasia) 01/26/2008   Past Medical History:  Diagnosis Date   Arthritis    "LITTLE IN NECK"   Benign prostatic hypertrophy    Cataract    COPD (chronic obstructive pulmonary disease)    GERD (gastroesophageal reflux disease)    Gout    Hyperlipidemia    no meds   Hypertension    Past Surgical History:  Procedure Laterality Date   APPENDECTOMY     CARPAL TUNNEL RELEASE     CATARACT EXTRACTION     CHOLECYSTECTOMY     COLONOSCOPY  04/2001 6/09   polyps   LASIK     liver hemangioma  05/24/2008   POLYPECTOMY     Social History   Tobacco Use   Smoking status: Every Day    Packs/day: 1.00    Years: 51.00    Additional pack years: 0.00    Total pack years: 51.00    Types: Cigarettes    Start date: 1970   Smokeless tobacco: Never  Vaping Use   Vaping Use: Never used  Substance Use Topics   Alcohol use: No    Alcohol/week: 0.0 standard drinks of alcohol   Drug use: No   Family History  Problem Relation Age of Onset   Cancer Father    Colon cancer Father    Heart disease Brother    Esophageal cancer Neg Hx    Stomach cancer Neg Hx    Rectal cancer Neg Hx    Crohn's disease Neg Hx    Allergies  Allergen Reactions   Chantix [Varenicline Tartrate]     Insomnia    Lipitor [Atorvastatin]     Leg pain   Watermelon [Citrullus Vulgaris]     Thinks it gave  Him hives   Current Outpatient Medications on File Prior to Visit  Medication Sig Dispense Refill   ibuprofen (ADVIL,MOTRIN) 200 MG  tablet Take 600 mg by mouth at bedtime as needed for mild  pain.     psyllium (REGULOID) 0.52 g capsule Take 0.52 g by mouth daily.     No current facility-administered medications on file prior to visit.    Review of Systems  Constitutional:  Positive for fatigue. Negative for activity change, appetite change, fever and unexpected weight change.  HENT:  Negative for congestion, rhinorrhea, sore throat and trouble swallowing.   Eyes:  Negative for pain, redness, itching and visual disturbance.  Respiratory:  Positive for cough. Negative for chest tightness, shortness of breath and wheezing.   Cardiovascular:  Negative for chest pain and palpitations.  Gastrointestinal:  Negative for abdominal pain, blood in stool, constipation, diarrhea and nausea.  Endocrine: Negative for cold intolerance, heat intolerance, polydipsia and polyuria.  Genitourinary:  Negative for difficulty urinating, dysuria, frequency and urgency.  Musculoskeletal:  Positive for arthralgias. Negative for joint swelling and myalgias.  Skin:  Negative for pallor and rash.  Neurological:  Negative for dizziness, tremors, weakness, numbness and headaches.  Hematological:  Negative for adenopathy. Does not bruise/bleed easily.  Psychiatric/Behavioral:  Negative for decreased concentration and dysphoric mood. The patient is not nervous/anxious.        Objective:   Physical Exam Constitutional:      General: He is not in acute distress.    Appearance: Normal appearance. He is well-developed and normal weight. He is not ill-appearing or diaphoretic.  HENT:     Head: Normocephalic and atraumatic.     Right Ear: Tympanic membrane, ear canal and external ear normal.     Left Ear: Tympanic membrane, ear canal and external ear normal.     Nose: Nose normal. No congestion.     Mouth/Throat:     Mouth: Mucous membranes are moist.     Pharynx: Oropharynx is clear. No posterior oropharyngeal erythema.  Eyes:     General: No  scleral icterus.       Right eye: No discharge.        Left eye: No discharge.     Conjunctiva/sclera: Conjunctivae normal.     Pupils: Pupils are equal, round, and reactive to light.  Neck:     Thyroid: No thyromegaly.     Vascular: No carotid bruit or JVD.  Cardiovascular:     Rate and Rhythm: Normal rate and regular rhythm.     Pulses: Normal pulses.     Heart sounds: Normal heart sounds.     No gallop.  Pulmonary:     Effort: Pulmonary effort is normal. No respiratory distress.     Breath sounds: Normal breath sounds. No wheezing or rales.     Comments: Diffusely distant bs  Chest:     Chest wall: No tenderness.  Abdominal:     General: Bowel sounds are normal. There is no distension or abdominal bruit.     Palpations: Abdomen is soft. There is no mass.     Tenderness: There is no abdominal tenderness.     Hernia: No hernia is present.  Musculoskeletal:        General: No tenderness.     Cervical back: Normal range of motion and neck supple. No rigidity. No muscular tenderness.     Right lower leg: No edema.     Left lower leg: No edema.  Lymphadenopathy:     Cervical: No cervical adenopathy.  Skin:    General: Skin is warm and dry.     Coloration: Skin is not pale.     Findings: No erythema or rash.  Comments: Solar lentigines diffusely Solar aging Some sks    Scattered AKs on L forearm   Small seb cyst on R mid back / not infected appearing   Neurological:     Mental Status: He is alert.     Cranial Nerves: No cranial nerve deficit.     Motor: No abnormal muscle tone.     Coordination: Coordination normal.     Gait: Gait normal.     Deep Tendon Reflexes: Reflexes are normal and symmetric. Reflexes normal.     Comments: Baseline head tremor   Psychiatric:        Mood and Affect: Mood normal.        Cognition and Memory: Cognition normal.           Assessment & Plan:   Problem List Items Addressed This Visit       Cardiovascular and  Mediastinum   Essential hypertension    bp in fair control at this time  BP Readings from Last 1 Encounters:  01/01/23 122/64  No changes needed Most recent labs reviewed  Disc lifstyle change with low sodium diet and exercise  Plan to continue amlodipine 10 mg daily      Relevant Medications   amLODipine (NORVASC) 10 MG tablet   rosuvastatin (CRESTOR) 5 MG tablet     Digestive   COLONIC POLYPS    Colonoscopy utd 01/2022 with 3 y recall        Musculoskeletal and Integument   AK (actinic keratosis)    More on L arm  Ref to derm for eval and tx  Also skin cancer screening  Counseled re: sun protectoin       Relevant Orders   Ambulatory referral to Dermatology     Genitourinary   BPH (benign prostatic hyperplasia)    Per pt no new urinary changes  Taking tamsulosin 0.4 mg and wants to continue Lab Results  Component Value Date   PSA 1.22 12/25/2022   PSA 0.72 12/04/2021   PSA 1.41 11/09/2020         Relevant Medications   tamsulosin (FLOMAX) 0.4 MG CAPS capsule     Other   Cigarette smoker    Disc in detail risks of smoking and possible outcomes including copd, vascular/ heart disease, cancer , respiratory and sinus infections  Pt voices understanding 1ppd  Knows he eventually has to quit but not ready yet  May consider otc nicotine gum   Less appetite with time/ some wt loss  He is now open to the lung cancer screening program with serial CT scans (he chooses this rather than pulmonary f/u)  No breathing complaints today but notes in general has worsened with time with some smokers cough worse at night  No inhalers currently       Relevant Orders   Ambulatory Referral Lung Cancer Screening [facility]   Cough    Pt declines pulmonary f/u but is interested in the lung cancer screen program with CT scans Cough is mostly at night Suspect COPD Not using any inhalers or otc meds currently       Hyperlipidemia    Disc goals for lipids and reasons  to control them Rev last labs with pt Rev low sat fat diet in detail LDL is down to 72 Unfortunately HDL is still low encouraged exercise for this Plan to continue Crestor 5 mg daily and good diet Patient confesses to eating for convenience       Relevant Medications  amLODipine (NORVASC) 10 MG tablet   rosuvastatin (CRESTOR) 5 MG tablet   Prediabetes    Lab Results  Component Value Date   HGBA1C 6.0 12/25/2022  Prediabetic range disc imp of low glycemic diet and wt loss to prevent DM2       Prostate cancer screening    Treats BPH with flomax No urinary changes   Lab Results  Component Value Date   PSA 1.22 12/25/2022   PSA 0.72 12/04/2021   PSA 1.41 11/09/2020         Routine general medical examination at a health care facility - Primary    Reviewed health habits including diet and exercise and skin cancer prevention Reviewed appropriate screening tests for age  Also reviewed health mt list, fam hx and immunization status , as well as social and family history   See HPI Labs reviewed and ordered See HPI Colonoscopy utd 01/2022 with 3 y recall Psa reviewed  Referred for lung cancer screening program  Counseled on smoking cessation  Counseled on bone health  Ref to derm for AK eval and also skin cancer screening Counseled re: sun protection        Screening for lung cancer    Ref to lung cancer screening program with CT 1ppd for over 30 years 67 yo male  Has copd   Not ready to quit        Relevant Orders   Ambulatory Referral Lung Cancer Screening [facility]   Weight loss    Wt is down 9 lb since march  Pt notes he has been eating less with less appetite  May be due to copd  Disc cancer screening  Utd prostate and colon cancer screening Ref for lung cancer screening   Counseled on healthy diet with more protein/less added sugars and more produce

## 2023-01-02 ENCOUNTER — Encounter: Payer: Self-pay | Admitting: *Deleted

## 2023-01-22 ENCOUNTER — Other Ambulatory Visit: Payer: Self-pay | Admitting: *Deleted

## 2023-01-22 DIAGNOSIS — Z87891 Personal history of nicotine dependence: Secondary | ICD-10-CM

## 2023-01-22 DIAGNOSIS — F17210 Nicotine dependence, cigarettes, uncomplicated: Secondary | ICD-10-CM

## 2023-01-22 DIAGNOSIS — Z122 Encounter for screening for malignant neoplasm of respiratory organs: Secondary | ICD-10-CM

## 2023-02-07 NOTE — Progress Notes (Unsigned)
Virtual Visit via Telephone Note  I connected with [name] [name] on 02/07/23 at  8:30 AM EDT by telephone and verified that I am speaking with the correct person using two identifiers.  Location: Patient: Home Provider: Office    I discussed the limitations, risks, security and privacy concerns of performing an evaluation and management service by telephone and the availability of in person appointments. I also discussed with the patient that there may be a patient responsible charge related to this service. The patient expressed understanding and agreed to proceed.     Shared Decision Making Visit Lung Cancer Screening Program [phone])   Eligibility: Age 67 y.o. Pack Years Smoking History Calculation 50 (# packs/per year x # years smoked) Recent History of coughing up blood  no Unexplained weight loss? no ( >Than 15 pounds within the last 6 months ) Prior History Lung / other cancer no (Diagnosis within the last 5 years already requiring surveillance chest CT Scans). Smoking Status Current Smoker Former Smokers: Years since quit: < 1 year  Quit Date: NA  Visit Components: Discussion included one or more decision making aids. yes Discussion included risk/benefits of screening. yes Discussion included potential follow up diagnostic testing for abnormal scans. yes Discussion included meaning and risk of over diagnosis. yes Discussion included meaning and risk of False Positives. yes Discussion included meaning of total radiation exposure. yes  Counseling Included: Importance of adherence to annual lung cancer LDCT screening. yes Impact of comorbidities on ability to participate in the program. yes Ability and willingness to under diagnostic treatment. yes  Smoking Cessation Counseling: Current Smokers:  Discussed importance of smoking cessation. yes Information about tobacco cessation classes and interventions provided to patient. yes Patient provided with "ticket" for  LDCT Scan. NA Symptomatic Patient. no  Counseling(Intermediate counseling: > three minutes) [id number] Diagnosis Code: Tobacco Use Z72.0 Asymptomatic Patient yes  Counseling (Intermediate counseling: > three minutes counseling) [id number] Former Smokers:  Discussed the importance of maintaining cigarette abstinence. yes Diagnosis Code: Personal History of Nicotine Dependence. W29.562 Information about tobacco cessation classes and interventions provided to patient. Yes Patient provided with "ticket" for LDCT Scan. na Written Order for Lung Cancer Screening with LDCT placed in Epic. Yes (CT Chest Lung Cancer Screening Low Dose W/O CM) [id number] Z12.2-Screening of respiratory organs Z87.891-Personal history of nicotine dependence  I have spent 25 minutes of face to face/ virtual visit   time with [name] [name] discussing the risks and benefits of lung cancer screening. We viewed / discussed a power point together that explained in detail the above noted topics. We paused at intervals to allow for questions to be asked and answered to ensure understanding.We discussed that the single most powerful action that he can take to decrease his risk of developing lung cancer is to quit smoking. We discussed whether or not he is ready to commit to setting a quit date. We discussed options for tools to aid in quitting smoking including nicotine replacement therapy, non-nicotine medications, support groups, Quit Smart classes, and behavior modification. We discussed that often times setting smaller, more achievable goals, such as eliminating 1 cigarette a day for a week and then 2 cigarettes a day for a week can be helpful in slowly decreasing the number of cigarettes smoked. This allows for a sense of accomplishment as well as providing a clinical benefit. I provided him  with smoking cessation  information  with contact information for community resources, classes, free nicotine replacement therapy, and access to mobile apps,  text messaging, and on-line smoking cessation help. I have also provided  him the office contact information in the event he needs to contact me, or the screening staff. We discussed the time and location of the scan, and that either Abigail Miyamoto RN, Karlton Lemon, RN  or I will call / send a letter with the results within 24-72 hours of receiving them. The patient verbalized understanding of all of  the above and had no further questions upon leaving the office. They have my contact information in the event they have any further questions.  I spent 3-5 minutes counseling on smoking cessation and the health risks of continued tobacco abuse.  I explained to the patient that there has been a high incidence of coronary artery disease noted on these exams. I explained that this is a non-gated exam therefore degree or severity cannot be determined. This patient is on statin therapy. I have asked the patient to follow-up with their PCP regarding any incidental finding of coronary artery disease and management with diet or medication as their PCP  feels is clinically indicated. The patient verbalized understanding of the above and had no further questions upon completion of the visit.    Glenford Bayley, NP

## 2023-02-10 ENCOUNTER — Ambulatory Visit (INDEPENDENT_AMBULATORY_CARE_PROVIDER_SITE_OTHER): Payer: Medicare Other | Admitting: Primary Care

## 2023-02-10 ENCOUNTER — Encounter: Payer: Self-pay | Admitting: Primary Care

## 2023-02-10 DIAGNOSIS — F17200 Nicotine dependence, unspecified, uncomplicated: Secondary | ICD-10-CM | POA: Diagnosis not present

## 2023-02-10 DIAGNOSIS — F17210 Nicotine dependence, cigarettes, uncomplicated: Secondary | ICD-10-CM

## 2023-02-10 NOTE — Patient Instructions (Addendum)
Thank you for participating in the [facility]. It was our pleasure to meet you today. We will call you with the results of your scan within the next few days. Your scan will be assigned a Lung RADS category score by the physicians reading the scans.  This Lung RADS score determines follow up scanning.  See below for description of categories, and follow up screening recommendations. We will be in touch to schedule your follow up screening annually or based on recommendations of our providers. We will fax a copy of your scan results to your Primary Care Physician, or the physician who referred you to the program, to ensure they have the results. Please call the office if you have any questions or concerns regarding your scanning experience or results.  Our office number is [phone]. Please speak with Abigail Miyamoto, RN. , or  Karlton Lemon RN, They are  our Lung Cancer Screening RN.'s If They are unavailable when you call, Please leave a message on the voice mail. We will return your call at our earliest convenience.This voice mail is monitored several times a day.  Remember, if your scan is normal, we will scan you annually as long as you continue to meet the criteria for the program. (Age 24-80, Current smoker or smoker who has quit within the last 15 years). If you are a smoker, remember, quitting is the single most powerful action that you can take to decrease your risk of lung cancer and other pulmonary, breathing related problems. We know quitting is hard, and we are here to help.  Please let us know if there is anything we can do to help you meet your goal of quitting. If you are a former smoker, Counselling psychologist. We are proud of you! Remain smoke free! Remember you can refer friends or family members through the number above.  We will screen them to make sure they meet criteria for the program. Thank you for helping [place] take better care of you by  participating in Lung Screening.  You can receive free nicotine replacement therapy ( patches, gum or mints) by calling 1-800-QUIT NOW. Please call so we can get you on the path to becoming  a non-smoker. I know it is hard, but you can do this!  Lung RADS Categories:  Lung RADS 1: no nodules or definitely non-concerning nodules.  Recommendation is for a repeat annual scan in 12 months.  Lung RADS 2:  nodules that are non-concerning in appearance and behavior with a very low likelihood of becoming an active cancer. Recommendation is for a repeat annual scan in 12 months.  Lung RADS 4 A: nodules with concerning findings, recommendation is most often for a follow up scan in 3 months or additional testing based on our provider's assessment of the scan. We will be in touch to make sure you have no questions and to schedule the recommended 3 month follow up scan.  Lung RADS 4 B:  indicates findings that are concerning. We will be in touch with you to schedule additional diagnostic testing based on our provider's  assessment of the scan.  Other options for assistance in smoking cessation ( As covered by your insurance benefits)  Hypnosis for smoking cessation  [employer] [phone]  Acupuncture for smoking cessation  [employer] [phone]

## 2023-02-11 ENCOUNTER — Ambulatory Visit
Admission: RE | Admit: 2023-02-11 | Discharge: 2023-02-11 | Disposition: A | Discharge status: Home / Self Care | Payer: Medicare Other | Source: Ambulatory Visit | Attending: Acute Care | Admitting: Acute Care

## 2023-02-11 DIAGNOSIS — Z122 Encounter for screening for malignant neoplasm of respiratory organs: Secondary | ICD-10-CM | POA: Diagnosis not present

## 2023-02-11 DIAGNOSIS — Z87891 Personal history of nicotine dependence: Secondary | ICD-10-CM | POA: Diagnosis not present

## 2023-02-11 DIAGNOSIS — F17210 Nicotine dependence, cigarettes, uncomplicated: Secondary | ICD-10-CM | POA: Diagnosis not present

## 2023-02-14 ENCOUNTER — Other Ambulatory Visit: Payer: Self-pay | Admitting: Acute Care

## 2023-02-14 DIAGNOSIS — Z87891 Personal history of nicotine dependence: Secondary | ICD-10-CM

## 2023-02-14 DIAGNOSIS — Z122 Encounter for screening for malignant neoplasm of respiratory organs: Secondary | ICD-10-CM

## 2023-02-14 DIAGNOSIS — F17210 Nicotine dependence, cigarettes, uncomplicated: Secondary | ICD-10-CM

## 2023-03-20 DIAGNOSIS — L821 Other seborrheic keratosis: Secondary | ICD-10-CM | POA: Diagnosis not present

## 2023-03-20 DIAGNOSIS — L814 Other melanin hyperpigmentation: Secondary | ICD-10-CM | POA: Diagnosis not present

## 2023-03-20 DIAGNOSIS — L728 Other follicular cysts of the skin and subcutaneous tissue: Secondary | ICD-10-CM | POA: Diagnosis not present

## 2023-03-20 DIAGNOSIS — D225 Melanocytic nevi of trunk: Secondary | ICD-10-CM | POA: Diagnosis not present

## 2023-03-20 DIAGNOSIS — L570 Actinic keratosis: Secondary | ICD-10-CM | POA: Diagnosis not present

## 2023-11-19 ENCOUNTER — Ambulatory Visit (INDEPENDENT_AMBULATORY_CARE_PROVIDER_SITE_OTHER): Payer: Medicare Other

## 2023-11-19 VITALS — Ht 71.0 in | Wt 176.0 lb

## 2023-11-19 DIAGNOSIS — Z0000 Encounter for general adult medical examination without abnormal findings: Secondary | ICD-10-CM | POA: Diagnosis not present

## 2023-11-19 DIAGNOSIS — F17200 Nicotine dependence, unspecified, uncomplicated: Secondary | ICD-10-CM | POA: Diagnosis not present

## 2023-11-19 NOTE — Patient Instructions (Addendum)
 Mr. [name] [name] , Thank you for taking time to come for your Medicare Wellness Visit. I appreciate your ongoing commitment to your health goals. Please review the following plan we discussed and let me know if I can assist you in the future.   Referrals/Orders/Follow-Ups/Clinician Recommendations: Aim for 30 minutes of exercise or brisk walking, 6-8 glasses of water, and 5 servings of fruits and vegetables each day. Aim for 30 minutes of exercise or brisk walking, 6-8 glasses of water, and 5 servings of fruits and vegetables each day.  Lung Cancer Screening test ordered for 2025.  Educated and advised on the Shingles, Influenza, Hepatitis C and COVID vaccines.  This is a list of the screening recommended for you and due dates:  Health Maintenance  Topic Date Due   Hepatitis C Screening  Never done   Zoster (Shingles) Vaccine (1 of 2) Never done   COVID-19 Vaccine (3 - Pfizer risk series) 07/24/2020   Flu Shot  04/24/2023   Screening for Lung Cancer  02/11/2024   Pneumonia Vaccine (3 of 3 - PPSV23 or PCV20) 08/12/2024   Medicare Annual Wellness Visit  11/18/2024   Colon Cancer Screening  02/11/2025   DTaP/Tdap/Td vaccine (4 - Td or Tdap) 05/21/2027   HPV Vaccine  Aged Out    Advanced directives: (Copy Requested) Please bring a copy of your health care power of attorney and living will to the office to be added to your chart at your convenience.  Next Medicare Annual Wellness Visit scheduled for next year: Yes - 11/2024

## 2023-11-19 NOTE — Progress Notes (Signed)
 Subjective:   [name] [name] is a 68 y.o. who presents for a Medicare Wellness preventive visit.  Visit Complete: Virtual I connected with  [name] [name] on 11/19/23 by a audio enabled telemedicine application and verified that I am speaking with the correct person using two identifiers.  Patient Location: Home  Provider Location: Office/Clinic  I discussed the limitations of evaluation and management by telemedicine. The patient expressed understanding and agreed to proceed.  Vital Signs: Because this visit was a virtual/telehealth visit, some criteria may be missing or patient reported. Any vitals not documented were not able to be obtained and vitals that have been documented are patient reported.  VideoDeclined- This patient declined Librarian, academic. Therefore the visit was completed with audio only.  AWV Questionnaire: Yes: Patient Medicare AWV questionnaire was completed by the patient on 11/18/2023; I have confirmed that all information answered by patient is correct and no changes since this date.  Cardiac Risk Factors include: male gender;advanced age (>26men, >63 women);dyslipidemia;hypertension;smoking/ tobacco exposure     Objective:    Today's Vitals   11/19/23 1200  Weight: 176 lb (79.8 kg)  Height: 5\' 11"  (1.803 m)   Body mass index is 24.55 kg/m.     11/19/2023   11:58 AM 12/16/2022    2:05 PM 03/23/2015    8:27 AM  Advanced Directives  Does Patient Have a Medical Advance Directive? Yes Yes No  Type of Estate agent of [place];Living will Healthcare Power of [place];Living will   Copy of Healthcare Power of Attorney in Chart? No - copy requested No - copy requested     Current Medications (verified) Outpatient Encounter Medications as of 11/19/2023  Medication Sig   amLODipine (NORVASC) 10 MG tablet Take 1 tablet (10 mg total) by mouth daily.   ibuprofen (ADVIL,MOTRIN) 200 MG tablet Take 600 mg by  mouth at bedtime as needed for mild pain.   rosuvastatin (CRESTOR) 5 MG tablet Take 1 tablet (5 mg total) by mouth daily.   tamsulosin (FLOMAX) 0.4 MG CAPS capsule TAKE 1 CAPSULE BY MOUTH EVERY DAY AFTER SUPPER   [DISCONTINUED] psyllium (REGULOID) 0.52 g capsule Take 0.52 g by mouth daily.   No facility-administered encounter medications on file as of 11/19/2023.    Allergies (verified) Chantix [varenicline tartrate], Lipitor [atorvastatin], and Watermelon [citrullus vulgaris]   History: Past Medical History:  Diagnosis Date   Arthritis    "LITTLE IN NECK"   Benign prostatic hypertrophy    Cataract    COPD (chronic obstructive pulmonary disease) (HCC)    GERD (gastroesophageal reflux disease)    Gout    Hyperlipidemia    no meds   Hypertension    Past Surgical History:  Procedure Laterality Date   APPENDECTOMY     CARPAL TUNNEL RELEASE     CATARACT EXTRACTION     CHOLECYSTECTOMY     COLONOSCOPY  04/2001 6/09   polyps   LASIK     liver hemangioma  05/24/2008   POLYPECTOMY     Family History  Problem Relation Age of Onset   Cancer Father    Colon cancer Father    Heart disease Brother    Esophageal cancer Neg Hx    Stomach cancer Neg Hx    Rectal cancer Neg Hx    Crohn's disease Neg Hx    Social History   Socioeconomic History   Marital status: Divorced    Spouse name: Not on file   Number  of children: Not on file   Years of education: Not on file   Highest education level: 12th grade  Occupational History   Occupation: Septic tank service   Tobacco Use   Smoking status: Every Day    Current packs/day: 1.00    Average packs/day: 1 pack/day for 55.2 years (55.2 ttl pk-yrs)    Types: Cigarettes    Start date: 1970    Passive exposure: Current   Smokeless tobacco: Never  Vaping Use   Vaping status: Never Used  Substance and Sexual Activity   Alcohol use: No    Alcohol/week: 0.0 standard drinks of alcohol   Drug use: No   Sexual activity: Not on file   Other Topics Concern   Not on file  Social History Narrative   Divorced, 2 children, works as a Naval architect.   Social Drivers of Corporate investment banker Strain: Low Risk  (11/19/2023)   Overall Financial Resource Strain (CARDIA)    Difficulty of Paying Living Expenses: Not hard at all  Food Insecurity: No Food Insecurity (11/19/2023)   Hunger Vital Sign    Worried About Running Out of Food in the Last Year: Never true    Ran Out of Food in the Last Year: Never true  Transportation Needs: No Transportation Needs (11/19/2023)   PRAPARE - Administrator, Civil Service (Medical): No    Lack of Transportation (Non-Medical): No  Physical Activity: Sufficiently Active (11/19/2023)   Exercise Vital Sign    Days of Exercise per Week: 5 days    Minutes of Exercise per Session: 60 min  Recent Concern: Physical Activity - Insufficiently Active (11/18/2023)   Exercise Vital Sign    Days of Exercise per Week: 3 days    Minutes of Exercise per Session: 20 min  Stress: Stress Concern Present (11/19/2023)   [employer] of Occupational Health - Occupational Stress Questionnaire    Feeling of Stress : To some extent  Social Connections: Socially Isolated (11/19/2023)   Social Connection and Isolation Panel [NHANES]    Frequency of Communication with Friends and Family: Once a week    Frequency of Social Gatherings with Friends and Family: Once a week    Attends Religious Services: 1 to 4 times per year    Active Member of [employer] or Organizations: No    Attends Banker Meetings: Never    Marital Status: Divorced    Tobacco Counseling - Current Cigarette Smoker Ready to quit: No Counseling given: Yes    Clinical Intake: Completed 11/19/2023  Pre-visit preparation completed: Yes  Pain : No/denies pain     BMI - recorded: 24.55 Nutritional Status: BMI of 19-24  Normal Nutritional Risks: None Diabetes: No  How often do you need to have someone help you  when you read instructions, pamphlets, or other written materials from your doctor or pharmacy?: 1 - Never  Interpreter Needed?: No  Information entered by :: Hassell Halim, CMA   Activities of Daily Living: Completed 11/19/2023    11/19/2023   12:01 PM 11/18/2023    8:46 PM  In your present state of health, do you have any difficulty performing the following activities:  Hearing? 0 0  Vision? 0 0  Difficulty concentrating or making decisions? 0 0  Walking or climbing stairs? 0 0  Dressing or bathing? 0 0  Doing errands, shopping? 0 0  Preparing Food and eating ? N N  Using the Toilet? N N  In the  past six months, have you accidently leaked urine? Y Y  Comment slight leakage - no Urology referral needed   Do you have problems with loss of bowel control? N N  Managing your Medications? N N  Managing your Finances? N N  Housekeeping or managing your Housekeeping? N N    Patient Care Team: Tower, Audrie Gallus, MD as PCP - General  Indicate any recent Medical Services you may have received from other than [facility] providers in the past year (date may be approximate).     Assessment:   This is a routine wellness examination for [name] [name].  Hearing/Vision screen Hearing Screening - Comments:: Denies hearing difficulties   Vision Screening - Comments:: No eyeglasses/contacts - had laser surgery   Goals Addressed               This Visit's Progress     Patient Stated (pt-stated)        Patient stated he plans to quit smoking.       Depression Screen Completed 11/19/2023    11/19/2023   12:04 PM 12/16/2022    2:04 PM 12/11/2021    9:13 AM 12/11/2021    8:39 AM 11/16/2020    8:36 AM 08/13/2019    9:09 AM 04/27/2018    8:49 AM  PHQ 2/9 Scores  PHQ - 2 Score 0 0 0 0 0 0 0  PHQ- 9 Score      0     Fall Risk Comnpleted 11/19/2023    11/19/2023   12:06 PM 11/18/2023    8:46 PM 12/16/2022    2:01 PM 12/11/2021    8:38 AM  Fall Risk   Falls in the past year? 0 0 0 0  Number falls in  past yr: 0 0 0   Injury with Fall? 0 0 0 0  Risk for fall due to : No Fall Risks  No Fall Risks   Follow up Falls evaluation completed;Falls prevention discussed  Falls prevention discussed;Falls evaluation completed Falls evaluation completed    MEDICARE RISK AT HOME: Completed 11/19/2023 Medicare Risk at Home Any stairs in or around the home?: Yes If so, are there any without handrails?: No Home free of loose throw rugs in walkways, pet beds, electrical cords, etc?: Yes Adequate lighting in your home to reduce risk of falls?: Yes Life alert?: No Use of a cane, walker or w/c?: No Grab bars in the bathroom?: No Shower chair or bench in shower?: Yes Elevated toilet seat or a handicapped toilet?: Yes  TIMED UP AND GO:  Was the test performed?  No  Cognitive Function: 6CIT completed        11/19/2023   12:06 PM 12/16/2022    2:06 PM  6CIT Screen  What Year? 0 points 0 points  What month? 0 points 0 points  What time? 0 points 0 points  Count back from 20 0 points 0 points  Months in reverse 0 points 0 points  Repeat phrase 0 points 0 points  Total Score 0 points 0 points    Immunizations Immunization History  Administered Date(s) Administered   Influenza Whole 06/08/2008   PFIZER(Purple Top)SARS-COV-2 Vaccination 06/05/2020, 06/26/2020   Pneumococcal Conjugate-13 12/11/2021   Pneumococcal Polysaccharide-23 05/18/2012, 08/13/2019   Td 03/29/2004   Tdap 01/30/2015, 05/20/2017    Screening Tests Health Maintenance  Topic Date Due   Hepatitis C Screening  Never done   Zoster Vaccines- Shingrix (1 of 2) Never done   COVID-19 Vaccine (3 -  Pfizer risk series) 07/24/2020   INFLUENZA VACCINE  04/24/2023   Lung Cancer Screening  02/11/2024   Pneumonia Vaccine 1+ Years old (3 of 3 - PPSV23 or PCV20) 08/12/2024   Medicare Annual Wellness (AWV)  11/18/2024   Colonoscopy  02/11/2025   DTaP/Tdap/Td (4 - Td or Tdap) 05/21/2027   HPV VACCINES  Aged Out    Health  Maintenance  Health Maintenance Due  Topic Date Due   Hepatitis C Screening  Never done   Zoster Vaccines- Shingrix (1 of 2) Never done   COVID-19 Vaccine (3 - Pfizer risk series) 07/24/2020   INFLUENZA VACCINE  04/24/2023   Health Maintenance Items Addressed: 11/19/2023.  Pt declined the COVID, Influenza, Hepatitis C and Shingles vaccines.  Educated and advised to get at [facility].   Additional Screening:  Vision Screening: Recommended annual ophthalmology exams for early detection of glaucoma and other disorders of the eye. Pt stated he had laser surgery and declined an appt w/an Ophthalmologist at this time.  Dental Screening: Recommended annual dental exams for proper oral hygiene  Community Resource Referral / Chronic Care Management: CRR required this visit?  No   CCM required this visit?  No     Plan:     I have personally reviewed and noted the following in the patient's chart:   Medical and social history Use of alcohol, tobacco or illicit drugs  Current medications and supplements including opioid prescriptions. Patient is not currently taking opioid prescriptions. Functional ability and status Nutritional status Physical activity Advanced directives List of other physicians Hospitalizations, surgeries, and ER visits in previous 12 months Vitals Screenings to include cognitive, depression, and falls Referrals and appointments  In addition, I have reviewed and discussed with patient certain preventive protocols, quality metrics, and best practice recommendations. A written personalized care plan for preventive services as well as general preventive health recommendations were provided to patient.     Darreld Mclean, CMA   11/19/2023   After Visit Summary: (MyChart) Due to this being a telephonic visit, the after visit summary with patients personalized plan was offered to patient via MyChart   Notes:  Lung Cancer Screening test ordered.

## 2023-12-27 ENCOUNTER — Other Ambulatory Visit: Payer: Self-pay | Admitting: Family Medicine

## 2024-01-02 ENCOUNTER — Encounter: Payer: Medicare Other | Admitting: Family Medicine

## 2024-01-30 ENCOUNTER — Ambulatory Visit (INDEPENDENT_AMBULATORY_CARE_PROVIDER_SITE_OTHER): Admitting: Family Medicine

## 2024-01-30 ENCOUNTER — Encounter: Payer: Self-pay | Admitting: Family Medicine

## 2024-01-30 VITALS — BP 110/60 | HR 71 | Temp 97.5°F | Ht 70.75 in | Wt 169.0 lb

## 2024-01-30 DIAGNOSIS — Z0000 Encounter for general adult medical examination without abnormal findings: Secondary | ICD-10-CM | POA: Diagnosis not present

## 2024-01-30 DIAGNOSIS — Z122 Encounter for screening for malignant neoplasm of respiratory organs: Secondary | ICD-10-CM

## 2024-01-30 DIAGNOSIS — D126 Benign neoplasm of colon, unspecified: Secondary | ICD-10-CM

## 2024-01-30 DIAGNOSIS — I10 Essential (primary) hypertension: Secondary | ICD-10-CM | POA: Diagnosis not present

## 2024-01-30 DIAGNOSIS — E7800 Pure hypercholesterolemia, unspecified: Secondary | ICD-10-CM

## 2024-01-30 DIAGNOSIS — Z125 Encounter for screening for malignant neoplasm of prostate: Secondary | ICD-10-CM

## 2024-01-30 DIAGNOSIS — F17210 Nicotine dependence, cigarettes, uncomplicated: Secondary | ICD-10-CM

## 2024-01-30 DIAGNOSIS — N401 Enlarged prostate with lower urinary tract symptoms: Secondary | ICD-10-CM

## 2024-01-30 DIAGNOSIS — R7303 Prediabetes: Secondary | ICD-10-CM

## 2024-01-30 LAB — PSA, MEDICARE: PSA: 1.03 ng/mL (ref 0.10–4.00)

## 2024-01-30 LAB — CBC WITH DIFFERENTIAL/PLATELET
Basophils Absolute: 0.1 10*3/uL (ref 0.0–0.1)
Basophils Relative: 1.1 % (ref 0.0–3.0)
Eosinophils Absolute: 0.1 10*3/uL (ref 0.0–0.7)
Eosinophils Relative: 1.2 % (ref 0.0–5.0)
HCT: 45.5 % (ref 39.0–52.0)
Hemoglobin: 15.2 g/dL (ref 13.0–17.0)
Lymphocytes Relative: 15.1 % (ref 12.0–46.0)
Lymphs Abs: 1.2 10*3/uL (ref 0.7–4.0)
MCHC: 33.4 g/dL (ref 30.0–36.0)
MCV: 95.1 fl (ref 78.0–100.0)
Monocytes Absolute: 0.5 10*3/uL (ref 0.1–1.0)
Monocytes Relative: 6.4 % (ref 3.0–12.0)
Neutro Abs: 6.1 10*3/uL (ref 1.4–7.7)
Neutrophils Relative %: 76.2 % (ref 43.0–77.0)
Platelets: 246 10*3/uL (ref 150.0–400.0)
RBC: 4.79 Mil/uL (ref 4.22–5.81)
RDW: 13.4 % (ref 11.5–15.5)
WBC: 8 10*3/uL (ref 4.0–10.5)

## 2024-01-30 LAB — COMPREHENSIVE METABOLIC PANEL WITH GFR
ALT: 12 U/L (ref 0–53)
AST: 12 U/L (ref 0–37)
Albumin: 4.2 g/dL (ref 3.5–5.2)
Alkaline Phosphatase: 84 U/L (ref 39–117)
BUN: 14 mg/dL (ref 6–23)
CO2: 27 meq/L (ref 19–32)
Calcium: 8.9 mg/dL (ref 8.4–10.5)
Chloride: 107 meq/L (ref 96–112)
Creatinine, Ser: 0.84 mg/dL (ref 0.40–1.50)
GFR: 89.97 mL/min (ref >60.00)
Glucose, Bld: 89 mg/dL (ref 70–99)
Potassium: 4.1 meq/L (ref 3.5–5.1)
Sodium: 141 meq/L (ref 135–145)
Total Bilirubin: 0.5 mg/dL (ref 0.2–1.2)
Total Protein: 6.6 g/dL (ref 6.0–8.3)

## 2024-01-30 LAB — LIPID PANEL
Cholesterol: 115 mg/dL (ref 0–200)
HDL: 30.7 mg/dL — ABNORMAL LOW (ref >39.00)
LDL Cholesterol: 69 mg/dL (ref 0–99)
NonHDL: 84.38
Total CHOL/HDL Ratio: 4
Triglycerides: 78 mg/dL (ref 0.0–149.0)
VLDL: 15.6 mg/dL (ref 0.0–40.0)

## 2024-01-30 LAB — HEMOGLOBIN A1C: Hgb A1c MFr Bld: 6.1 % (ref 4.6–6.5)

## 2024-01-30 LAB — TSH: TSH: 1.02 u[IU]/mL (ref 0.35–5.50)

## 2024-01-30 NOTE — Assessment & Plan Note (Signed)
 Psa today  Has bph

## 2024-01-30 NOTE — Assessment & Plan Note (Signed)
 Disc in detail risks of smoking and possible outcomes including copd, vascular/ heart disease, cancer , respiratory and sinus infections as well as osteoporosis  Pt voices understanding Trying to cut back Not ready to quit  Has failed nicotine repl in past  Intol of chantix 

## 2024-01-30 NOTE — Progress Notes (Signed)
 Subjective:    Patient ID: [name] [name], male    DOB: Dec 31, 1955, 68 y.o.   MRN: [id number]  HPI  Here for health maintenance exam and to review chronic medical problems   Wt Readings from Last 3 Encounters:  01/30/24 169 lb (76.7 kg)  11/19/23 176 lb (79.8 kg)  01/01/23 176 lb 2 oz (79.9 kg)   23.74 kg/m  Vitals:   01/30/24 0812  BP: 110/60  Pulse: 71  Temp: (!) 97.5 F (36.4 C)  SpO2: 95%    Immunization History  Administered Date(s) Administered   Influenza Whole 06/08/2008   PFIZER(Purple Top)SARS-COV-2 Vaccination 06/05/2020, 06/26/2020   Pneumococcal Conjugate-13 12/11/2021   Pneumococcal Polysaccharide-23 05/18/2012, 08/13/2019   Td 03/29/2004   Tdap 01/30/2015, 05/20/2017    Health Maintenance Due  Topic Date Due   COVID-19 Vaccine (3 - Pfizer risk series) 07/24/2020   Lung Cancer Screening  02/11/2024    Hep C screening - low risk/ declines   Shingrix- not interested    Lung cancer screening -annual / due this month Due this month -has not heard yet   Smoking status  1/2 ppd to 1 ppd  Making effort to take a few puffs and put it down  Does not smoke at home  Nicotine repl does not work for him  Chantix  caused insomnia     Prostate health Lab Results  Component Value Date   PSA 1.03 01/30/2024   PSA 1.22 12/25/2022   PSA 0.72 12/04/2021   Has BPH Takes flomax  0.4 mg daily Nocturia - 0-1 time  Declines urology     Colon cancer screening  Colonoscopy 01/2022 with 3 y recall  Bone health  Lots of exercise with work  / hard work/digging and heavy lifting   Falls-none Fractures-none  Supplements -no vitamins  Drinks milk Eats ice cream    Exercise - lots   Derm care  No new skin cancers or moles  Has been to dermatology   Appetite is fair  Does not eat much during the day    Mood    01/30/2024    8:16 AM 11/19/2023   12:04 PM 12/16/2022    2:04 PM 12/11/2021    9:13 AM 12/11/2021    8:39 AM  Depression screen PHQ  2/9  Decreased Interest 0 0 0 0 0  Down, Depressed, Hopeless 0 0 0 0 0  PHQ - 2 Score 0 0 0 0 0  Altered sleeping 0      Tired, decreased energy 0      Change in appetite 0      Feeling bad or failure about yourself  0      Trouble concentrating 0      Moving slowly or fidgety/restless 0      Suicidal thoughts 0      PHQ-9 Score 0      Difficult doing work/chores Not difficult at all        HTN bp is stable today  No cp or palpitations or headaches or edema  No side effects to medicines  BP Readings from Last 3 Encounters:  01/30/24 110/60  01/01/23 122/64  02/11/22 131/72    Amlodipine  10 mg daily  Pulse Readings from Last 3 Encounters:  01/30/24 71  01/01/23 (!) 57  02/11/22 63     Lab Results  Component Value Date   NA 141 01/30/2024   K 4.1 01/30/2024   CO2 27 01/30/2024   GLUCOSE 89 01/30/2024  BUN 14 01/30/2024   CREATININE 0.84 01/30/2024   CALCIUM  8.9 01/30/2024   GFR 89.97 01/30/2024   GFRNONAA 44 (L) 03/29/2018   Due for labs   Hyperlipidemia Lab Results  Component Value Date   CHOL 115 01/30/2024   HDL 30.70 (L) 01/30/2024   LDLCALC 69 01/30/2024   LDLDIRECT 97.0 12/04/2021   TRIG 78.0 01/30/2024   CHOLHDL 4 01/30/2024   Takes crestor  5 mg daily  Prediabetes Lab Results  Component Value Date   HGBA1C 6.1 01/30/2024   HGBA1C 6.0 12/25/2022   Due for lab    Patient Active Problem List   Diagnosis Date Noted   Routine general medical examination at a health care facility 01/01/2023   Screening for lung cancer 01/01/2023   Prediabetes 12/24/2022   AK (actinic keratosis) 11/16/2020   Neck pain on left side 12/30/2019   Benign head tremor 12/30/2019   Peyronie's disease 08/13/2019   Weight loss 08/13/2019   History of kidney stones 04/27/2018   Chronic left shoulder pain 11/07/2017   Cough 01/30/2015   Essential hypertension 01/30/2015   Family history of colon cancer 01/30/2015   Prostate cancer screening 05/18/2012   COLONIC  POLYPS 01/27/2008   Cigarette smoker 01/27/2008   FOOT PAIN, LEFT 01/27/2008   SYNCOPE 01/27/2008   FATIGUE 01/27/2008   Hyperlipidemia 01/26/2008   GOUT 01/26/2008   BPH (benign prostatic hyperplasia) 01/26/2008   Past Medical History:  Diagnosis Date   Arthritis    "LITTLE IN NECK"   Benign prostatic hypertrophy    Cataract    COPD (chronic obstructive pulmonary disease) (HCC)    GERD (gastroesophageal reflux disease)    Gout    Hyperlipidemia    no meds   Hypertension    Past Surgical History:  Procedure Laterality Date   APPENDECTOMY     CARPAL TUNNEL RELEASE     CATARACT EXTRACTION     CHOLECYSTECTOMY     COLONOSCOPY  04/2001 6/09   polyps   LASIK     liver hemangioma  05/24/2008   POLYPECTOMY     Social History   Tobacco Use   Smoking status: Every Day    Current packs/day: 1.00    Average packs/day: 1 pack/day for 55.4 years (55.4 ttl pk-yrs)    Types: Cigarettes    Start date: 1970    Passive exposure: Current   Smokeless tobacco: Never  Vaping Use   Vaping status: Never Used  Substance Use Topics   Alcohol use: No    Alcohol/week: 0.0 standard drinks of alcohol   Drug use: No   Family History  Problem Relation Age of Onset   Cancer Father    Colon cancer Father    Heart disease Brother    Esophageal cancer Neg Hx    Stomach cancer Neg Hx    Rectal cancer Neg Hx    Crohn's disease Neg Hx    Allergies  Allergen Reactions   Chantix  [Varenicline  Tartrate]     Insomnia    Lipitor [Atorvastatin ]     Leg pain   Watermelon [Citrullus Vulgaris]     Thinks it gave  Him hives   Current Outpatient Medications on File Prior to Visit  Medication Sig Dispense Refill   amLODipine  (NORVASC ) 10 MG tablet Take 1 tablet (10 mg total) by mouth daily. 90 tablet 3   ibuprofen (ADVIL,MOTRIN) 200 MG tablet Take 600 mg by mouth at bedtime as needed for mild pain.     rosuvastatin  (CRESTOR )  5 MG tablet Take 1 tablet (5 mg total) by mouth daily. 90 tablet 3    tamsulosin  (FLOMAX ) 0.4 MG CAPS capsule TAKE 1 CAPSULE BY MOUTH EVERY DAY AFTER SUPPER 90 capsule 0   No current facility-administered medications on file prior to visit.    Review of Systems  Constitutional:  Negative for activity change, appetite change, fatigue, fever and unexpected weight change.  HENT:  Negative for congestion, rhinorrhea, sore throat and trouble swallowing.   Eyes:  Negative for pain, redness, itching and visual disturbance.  Respiratory:  Positive for cough. Negative for chest tightness, shortness of breath and wheezing.   Cardiovascular:  Negative for chest pain and palpitations.  Gastrointestinal:  Negative for abdominal pain, blood in stool, constipation, diarrhea and nausea.  Endocrine: Negative for cold intolerance, heat intolerance, polydipsia and polyuria.  Genitourinary:  Negative for difficulty urinating, dysuria, frequency and urgency.  Musculoskeletal:  Positive for myalgias. Negative for arthralgias and joint swelling.  Skin:  Negative for pallor and rash.  Neurological:  Negative for dizziness, tremors, weakness, numbness and headaches.  Hematological:  Negative for adenopathy. Does not bruise/bleed easily.  Psychiatric/Behavioral:  Negative for decreased concentration and dysphoric mood. The patient is not nervous/anxious.        Objective:   Physical Exam Constitutional:      General: He is not in acute distress.    Appearance: Normal appearance. He is well-developed and normal weight. He is not ill-appearing or diaphoretic.  HENT:     Head: Normocephalic and atraumatic.     Right Ear: Tympanic membrane, ear canal and external ear normal.     Left Ear: Tympanic membrane, ear canal and external ear normal.     Nose: Nose normal. No congestion.     Mouth/Throat:     Mouth: Mucous membranes are moist.     Pharynx: Oropharynx is clear. No posterior oropharyngeal erythema.  Eyes:     General: No scleral icterus.       Right eye: No discharge.         Left eye: No discharge.     Conjunctiva/sclera: Conjunctivae normal.     Pupils: Pupils are equal, round, and reactive to light.  Neck:     Thyroid : No thyromegaly.     Vascular: No carotid bruit or JVD.  Cardiovascular:     Rate and Rhythm: Normal rate and regular rhythm.     Pulses: Normal pulses.     Heart sounds: Normal heart sounds.     No gallop.  Pulmonary:     Effort: Pulmonary effort is normal. No respiratory distress.     Breath sounds: Normal breath sounds. No wheezing or rales.     Comments: Diffusely distant bs No wheeze  Chest:     Chest wall: No tenderness.  Abdominal:     General: Bowel sounds are normal. There is no distension or abdominal bruit.     Palpations: Abdomen is soft. There is no mass.     Tenderness: There is no abdominal tenderness.     Hernia: No hernia is present.  Musculoskeletal:        General: No tenderness.     Cervical back: Normal range of motion and neck supple. No rigidity. No muscular tenderness.     Right lower leg: No edema.     Left lower leg: No edema.  Lymphadenopathy:     Cervical: No cervical adenopathy.  Skin:    General: Skin is warm and dry.  Coloration: Skin is not pale.     Findings: No erythema or rash.     Comments: Tanned Solar lentigines diffusely   Neurological:     Mental Status: He is alert.     Cranial Nerves: No cranial nerve deficit.     Motor: No abnormal muscle tone.     Coordination: Coordination normal.     Gait: Gait normal.     Deep Tendon Reflexes: Reflexes are normal and symmetric. Reflexes normal.  Psychiatric:        Mood and Affect: Mood normal.        Cognition and Memory: Cognition and memory normal.           Assessment & Plan:   Problem List Items Addressed This Visit       Cardiovascular and Mediastinum   Essential hypertension   bp in fair control at this time  BP Readings from Last 1 Encounters:  01/30/24 110/60   No changes needed Most recent labs reviewed   Disc lifstyle change with low sodium diet and exercise  Plan to continue amlodipine  10 mg daily      Relevant Orders   TSH (Completed)   Lipid Panel (Completed)   Comprehensive metabolic panel with GFR (Completed)   CBC with Differential/Platelet (Completed)     Digestive   COLONIC POLYPS   Colonoscopy 01/2022 with 3 y recall         Genitourinary   BPH (benign prostatic hyperplasia)   No clinical changes Takes flomax  0.4 mg daily  Nocturia times one Declines urology ref [facility] today      Relevant Orders   PSA, Medicare (Completed)     Other   Screening for lung cancer   Due soon for CT/ screening       Routine general medical examination at a health care facility - Primary   Reviewed health habits including diet and exercise and skin cancer prevention Reviewed appropriate screening tests for age  Also reviewed health mt list, fam hx and immunization status , as well as social and family history   See HPI Labs reviewed and ordered Health Maintenance  Topic Date Due   COVID-19 Vaccine (3 - Pfizer risk series) 07/24/2020   Screening for Lung Cancer  02/11/2024   Zoster (Shingles) Vaccine (1 of 2) 05/01/2024*   Hepatitis C Screening  01/29/2025*   Flu Shot  04/23/2024   Medicare Annual Wellness Visit  11/18/2024   Colon Cancer Screening  02/11/2025   Pneumonia Vaccine (3 of 3 - PCV20 or PCV21) 12/12/2026   DTaP/Tdap/Td vaccine (4 - Td or Tdap) 05/21/2027   HPV Vaccine  Aged Out   Meningitis B Vaccine  Aged Out  *Topic was postponed. The date shown is not the original due date.    Declines hep C screen-low risk Declines shingrix vaccines  Counseled re: smoking cessatoin  Psa pending  Discussed fall prevention, supplements and exercise for bone density  Utd derm care/encouraged use of sun protection  PHQ 0       Prostate cancer screening   Psa today  Has bph       Relevant Orders   PSA, Medicare (Completed)   Prediabetes   A1c today  disc imp of low  glycemic diet and wt loss to prevent DM2       Relevant Orders   Hemoglobin A1c (Completed)   Hyperlipidemia   Disc goals for lipids and reasons to control them Rev last labs with pt Rev low  sat fat diet in detail  Continues crestor  5 mg daily   Lab today      Relevant Orders   Lipid Panel (Completed)   Comprehensive metabolic panel with GFR (Completed)   Cigarette smoker   Disc in detail risks of smoking and possible outcomes including copd, vascular/ heart disease, cancer , respiratory and sinus infections as well as osteoporosis  Pt voices understanding Trying to cut back Not ready to quit  Has failed nicotine repl in past  Intol of chantix 

## 2024-01-30 NOTE — Assessment & Plan Note (Signed)
 Disc goals for lipids and reasons to control them Rev last labs with pt Rev low sat fat diet in detail  Continues crestor  5 mg daily   Lab today

## 2024-01-30 NOTE — Assessment & Plan Note (Signed)
 Colonoscopy 01/2022 with 3 y recall

## 2024-01-30 NOTE — Assessment & Plan Note (Signed)
 Reviewed health habits including diet and exercise and skin cancer prevention Reviewed appropriate screening tests for age  Also reviewed health mt list, fam hx and immunization status , as well as social and family history   See HPI Labs reviewed and ordered Health Maintenance  Topic Date Due   COVID-19 Vaccine (3 - Pfizer risk series) 07/24/2020   Screening for Lung Cancer  02/11/2024   Zoster (Shingles) Vaccine (1 of 2) 05/01/2024*   Hepatitis C Screening  01/29/2025*   Flu Shot  04/23/2024   Medicare Annual Wellness Visit  11/18/2024   Colon Cancer Screening  02/11/2025   Pneumonia Vaccine (3 of 3 - PCV20 or PCV21) 12/12/2026   DTaP/Tdap/Td vaccine (4 - Td or Tdap) 05/21/2027   HPV Vaccine  Aged Out   Meningitis B Vaccine  Aged Out  *Topic was postponed. The date shown is not the original due date.    Declines hep C screen-low risk Declines shingrix vaccines  Counseled re: smoking cessatoin  Psa pending  Discussed fall prevention, supplements and exercise for bone density  Utd derm care/encouraged use of sun protection  PHQ 0

## 2024-01-30 NOTE — Assessment & Plan Note (Signed)
 A1c today  disc imp of low glycemic diet and wt loss to prevent DM2

## 2024-01-30 NOTE — Assessment & Plan Note (Signed)
 bp in fair control at this time  BP Readings from Last 1 Encounters:  01/30/24 110/60   No changes needed Most recent labs reviewed  Disc lifstyle change with low sodium diet and exercise  Plan to continue amlodipine  10 mg daily

## 2024-01-30 NOTE — Assessment & Plan Note (Signed)
 No clinical changes Takes flomax  0.4 mg daily  Nocturia times one Declines urology ref [facility] today

## 2024-01-30 NOTE — Patient Instructions (Addendum)
 Continue the high calcium  foods  Get vitamin D3 and take 2000 international units daily  For bone health   Don't forget sun protection when working outside   Make sure to work on protein intake  The following are examples of protein in diet  Meat  Fish  Eggs  Dairy products  Soy products  Oat milk  Almond milk Nuts and nut butters  Legumes  Dried beans    I will reach out to pulmonary about your next scan for lung cancer screening   Keep thinking about quitting smoking   Labs today

## 2024-01-30 NOTE — Assessment & Plan Note (Signed)
 Due soon for CT/ screening

## 2024-02-17 ENCOUNTER — Other Ambulatory Visit: Payer: Self-pay | Admitting: Acute Care

## 2024-02-17 ENCOUNTER — Other Ambulatory Visit: Payer: Self-pay | Admitting: Family Medicine

## 2024-02-17 DIAGNOSIS — F17210 Nicotine dependence, cigarettes, uncomplicated: Secondary | ICD-10-CM

## 2024-02-17 DIAGNOSIS — Z122 Encounter for screening for malignant neoplasm of respiratory organs: Secondary | ICD-10-CM

## 2024-02-17 DIAGNOSIS — Z87891 Personal history of nicotine dependence: Secondary | ICD-10-CM

## 2024-02-22 ENCOUNTER — Other Ambulatory Visit: Payer: Self-pay | Admitting: Family Medicine

## 2024-03-24 ENCOUNTER — Ambulatory Visit
Admission: RE | Admit: 2024-03-24 | Discharge: 2024-03-24 | Disposition: A | Discharge status: Home / Self Care | Source: Ambulatory Visit | Attending: Acute Care | Admitting: Acute Care

## 2024-03-24 ENCOUNTER — Other Ambulatory Visit: Payer: Self-pay | Admitting: Family Medicine

## 2024-03-24 DIAGNOSIS — F17210 Nicotine dependence, cigarettes, uncomplicated: Secondary | ICD-10-CM | POA: Insufficient documentation

## 2024-03-24 DIAGNOSIS — Z87891 Personal history of nicotine dependence: Secondary | ICD-10-CM | POA: Diagnosis not present

## 2024-03-24 DIAGNOSIS — Z122 Encounter for screening for malignant neoplasm of respiratory organs: Secondary | ICD-10-CM | POA: Insufficient documentation

## 2024-04-05 ENCOUNTER — Other Ambulatory Visit: Payer: Self-pay

## 2024-04-05 ENCOUNTER — Telehealth: Payer: Self-pay | Admitting: Family Medicine

## 2024-04-05 DIAGNOSIS — F17200 Nicotine dependence, unspecified, uncomplicated: Secondary | ICD-10-CM

## 2024-04-05 DIAGNOSIS — Z87891 Personal history of nicotine dependence: Secondary | ICD-10-CM

## 2024-04-05 DIAGNOSIS — I2510 Atherosclerotic heart disease of native coronary artery without angina pectoris: Secondary | ICD-10-CM | POA: Insufficient documentation

## 2024-04-05 DIAGNOSIS — Z122 Encounter for screening for malignant neoplasm of respiratory organs: Secondary | ICD-10-CM

## 2024-04-05 DIAGNOSIS — I700 Atherosclerosis of aorta: Secondary | ICD-10-CM | POA: Insufficient documentation

## 2024-04-05 NOTE — Telephone Encounter (Signed)
 Will send to PCP and referral dpt

## 2024-04-05 NOTE — Telephone Encounter (Signed)
 Received CRM stating pt is requesting Cardiology referral be sent to a clinic in [place].

## 2024-04-05 NOTE — Telephone Encounter (Signed)
 The referral is in.

## 2024-04-05 NOTE — Telephone Encounter (Signed)
 Incidental notation from his CT for lung cancer screen noted aortic valve calcification and extensive coronary artery calcification (indicating likely atherosclerosis) I would like to get him established with cardiology to discuss these findings in more detail and see if more testing needs to be done  Aware cholesterol is controlled   Is he open to a cardiology referral?   Thanks      From imaging :  IMPRESSION: Lung-RADS 2S, benign appearance or behavior. Continue annual screening with low-dose chest CT without contrast in 12 months.   Stable findings most in keeping with respiratory bronchiolitis (RB-ILD).   Airway inflammation.   Aortic valvular calcification. Extensive coronary artery calcification.   Emphysema (ICD10-J43.9).

## 2024-04-05 NOTE — Telephone Encounter (Signed)
 Pt notified of results and Dr. Graham comments. Pt agreed with cardiology referral. He is okay seeing someone in [place] or [place], pt advised referral dpt will work on it, he may receive a letter in his mychart. He also may receive a call, but pt advise if he hasn't received some type of communication in 2 weeks to let us  know

## 2024-04-09 NOTE — Telephone Encounter (Signed)
 Referral updated to [facility].   See referral for further updates

## 2024-12-20 ENCOUNTER — Ambulatory Visit: Payer: Medicare Other
